# Patient Record
Sex: Female | Born: 1955 | Race: Black or African American | Hispanic: No | Smoking: Never smoker
Health system: Southern US, Community
[De-identification: ages and names within clinical notes are randomized; demographics above are authoritative.]

## PROBLEM LIST (undated history)

## (undated) ENCOUNTER — Ambulatory Visit

## (undated) ENCOUNTER — Encounter

## (undated) ENCOUNTER — Telehealth

## (undated) ENCOUNTER — Ambulatory Visit: Payer: BLUE CROSS/BLUE SHIELD | Attending: Family Medicine | Primary: Family Medicine

## (undated) ENCOUNTER — Ambulatory Visit: Payer: MEDICARE

## (undated) ENCOUNTER — Encounter
Attending: Student in an Organized Health Care Education/Training Program | Primary: Student in an Organized Health Care Education/Training Program

## (undated) ENCOUNTER — Encounter: Attending: Dermatology | Primary: Dermatology

## (undated) ENCOUNTER — Telehealth: Attending: Family Medicine | Primary: Family Medicine

## (undated) ENCOUNTER — Ambulatory Visit: Attending: Pharmacist | Primary: Pharmacist

## (undated) ENCOUNTER — Encounter: Attending: Family Medicine | Primary: Family Medicine

## (undated) ENCOUNTER — Ambulatory Visit: Payer: BLUE CROSS/BLUE SHIELD

## (undated) ENCOUNTER — Ambulatory Visit: Payer: BLUE CROSS/BLUE SHIELD | Attending: Family | Primary: Family

## (undated) ENCOUNTER — Ambulatory Visit
Payer: MEDICARE | Attending: Student in an Organized Health Care Education/Training Program | Primary: Student in an Organized Health Care Education/Training Program

## (undated) ENCOUNTER — Ambulatory Visit: Payer: BLUE CROSS/BLUE SHIELD | Attending: Rheumatology | Primary: Rheumatology

## (undated) ENCOUNTER — Encounter: Attending: Rheumatology | Primary: Rheumatology

## (undated) ENCOUNTER — Encounter: Attending: Diagnostic Radiology | Primary: Diagnostic Radiology

## (undated) ENCOUNTER — Encounter: Payer: BLUE CROSS/BLUE SHIELD | Attending: Family Medicine | Primary: Family Medicine

## (undated) ENCOUNTER — Ambulatory Visit
Payer: BLUE CROSS/BLUE SHIELD | Attending: Student in an Organized Health Care Education/Training Program | Primary: Student in an Organized Health Care Education/Training Program

## (undated) DIAGNOSIS — L409 Psoriasis, unspecified: Secondary | ICD-10-CM

## (undated) HISTORY — PX: KNEE SURGERY: SHX244

---
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
## 2009-10-13 ENCOUNTER — Ambulatory Visit: Payer: Self-pay

## 2010-02-12 ENCOUNTER — Emergency Department: Payer: Self-pay | Admitting: Emergency Medicine

## 2010-11-19 ENCOUNTER — Ambulatory Visit: Payer: Self-pay

## 2011-12-30 ENCOUNTER — Ambulatory Visit: Payer: Self-pay | Admitting: Family Medicine

## 2012-06-13 IMAGING — MG MM DIGITAL SCREENING BILAT W/ CAD
1 series · 7 of 7 positions shown · non-contrast
Comparison: none

REASON FOR EXAM: SCR MAMMO
COMMENTS:

[Series 1955: R CC · right · 7 of 7 slices shown]
[im 1/7]
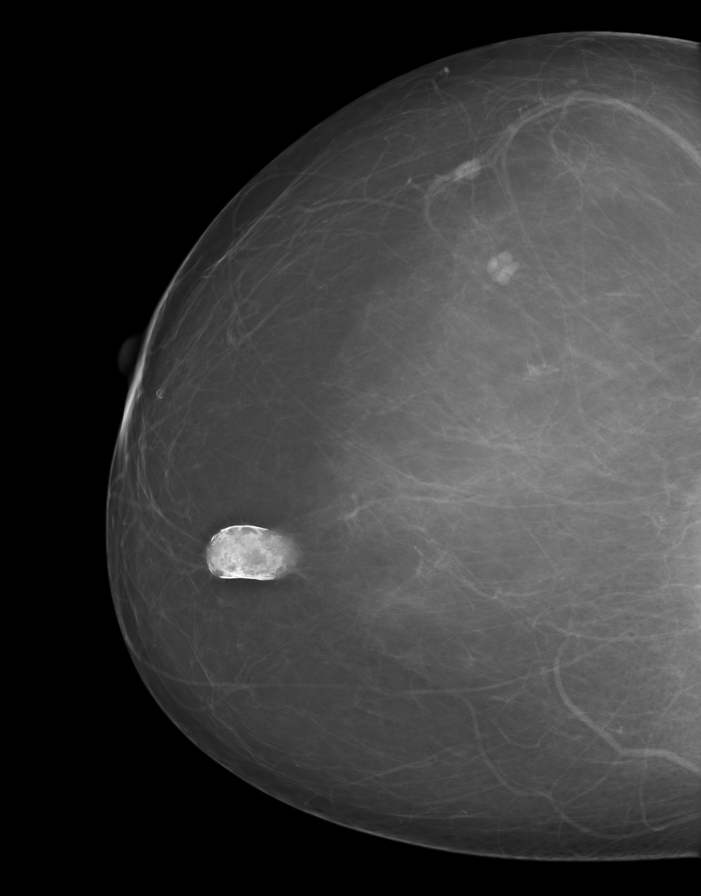
[im 2/7]
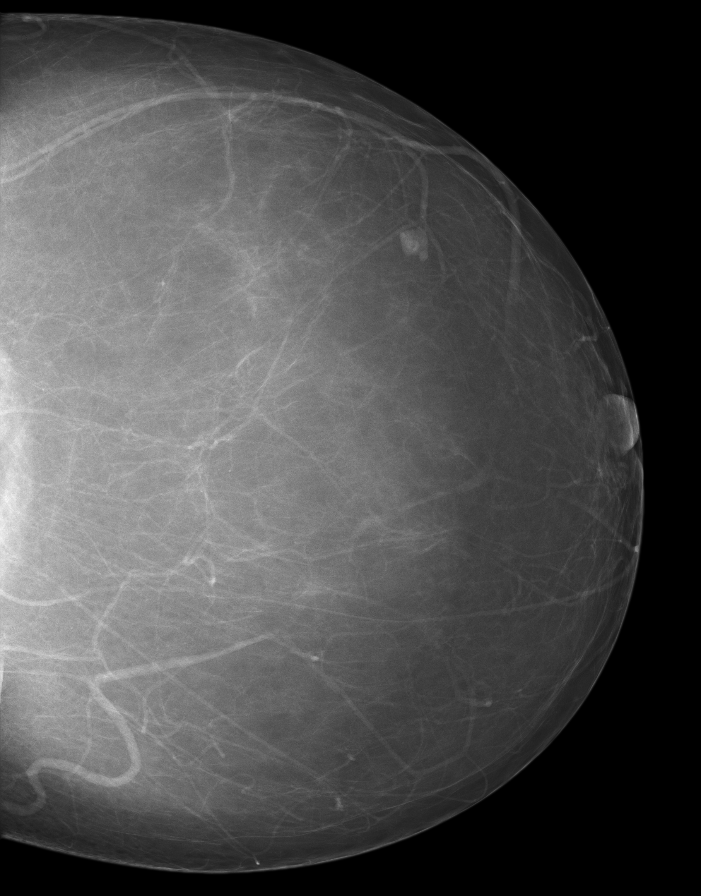
[im 3/7]
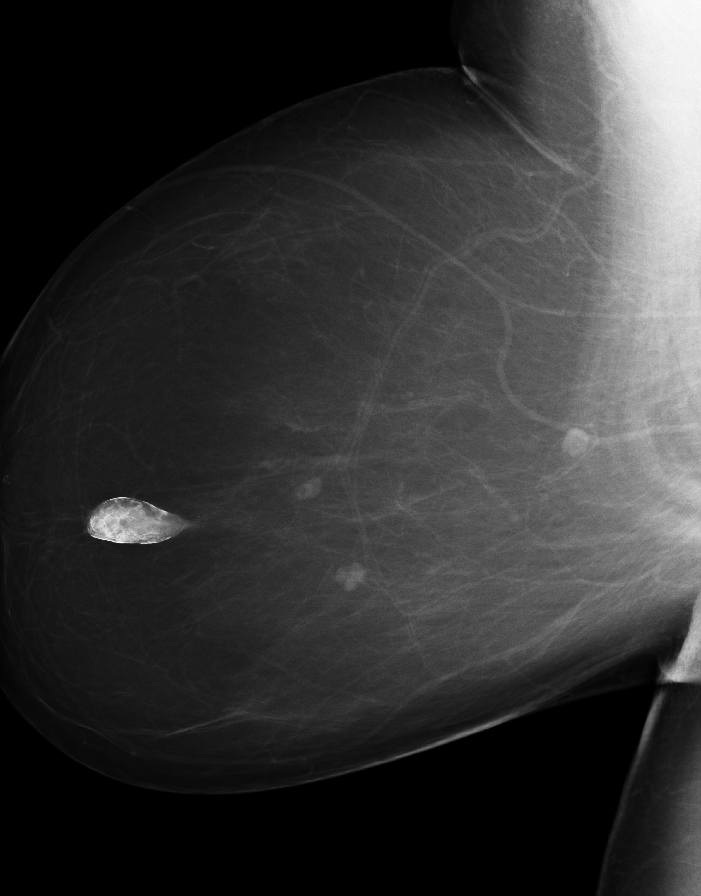
[im 4/7]
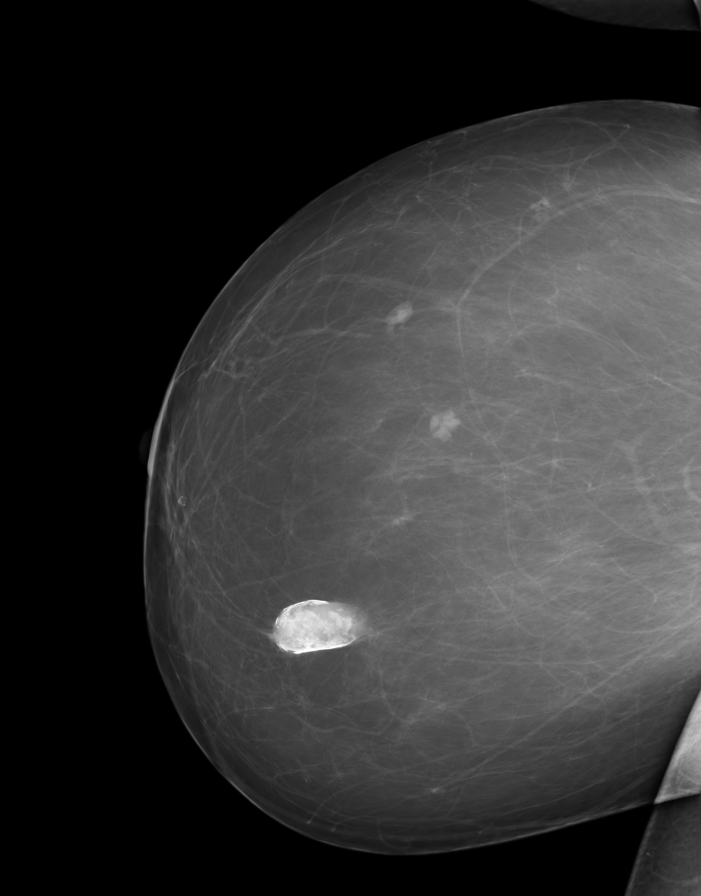
[im 5/7]
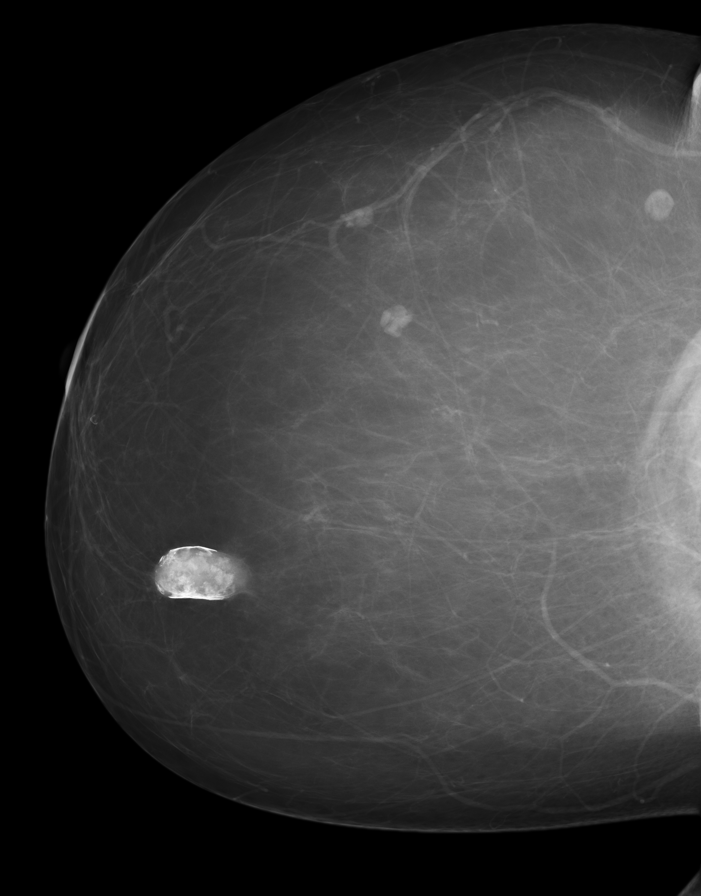
[im 6/7]
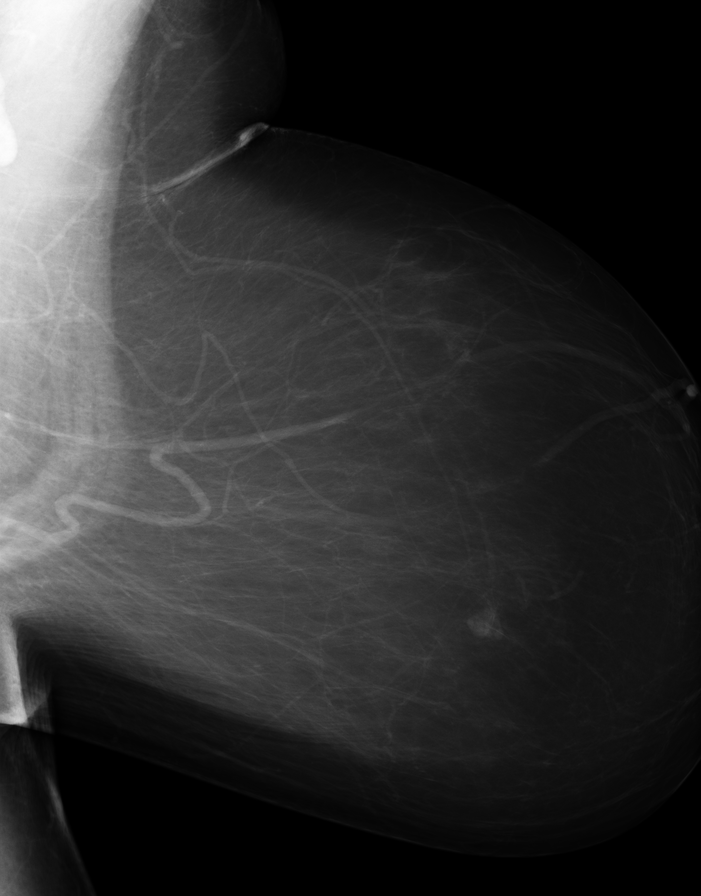
[im 7/7]
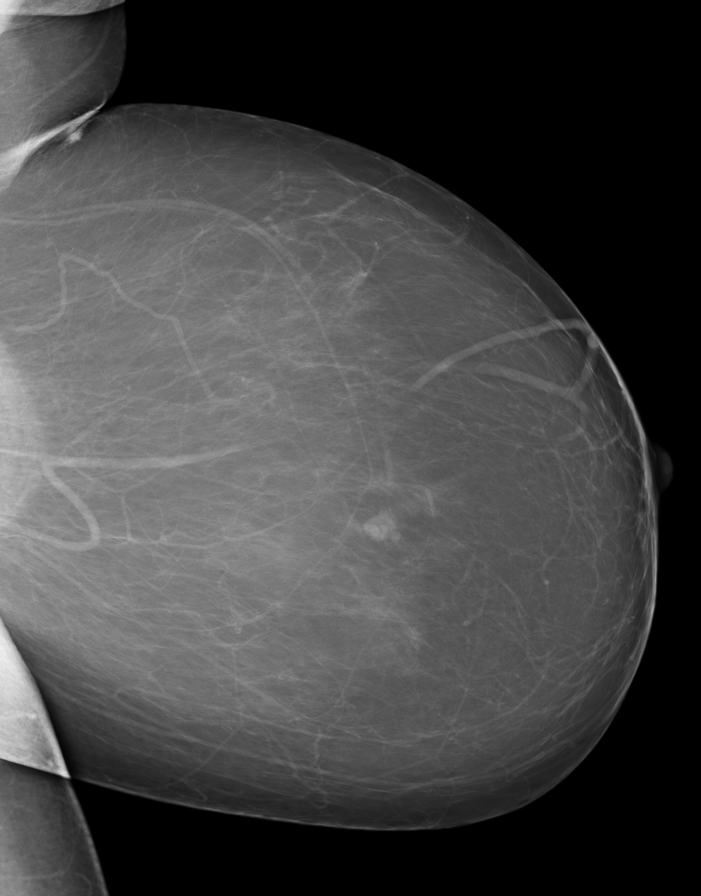

[7 of 7 positions shown; findings below may reference images not displayed]

PROCEDURE:     MMM - MMM DGTL SCREENING MAMMO W/CAD  - November 19, 2010  [DATE]

RESULT:     Comparison is made to study 13 October, 2009, as well as 05 December, 2007.

The breasts exhibit an involutional pattern. There is stable nodularity
bilaterally. A calcified mass in the anterior aspect of the right breast
medially is present and stable. I see no malignant appearing grouping of
microcalcification and no area of new architectural distortion.
IMPRESSION: 1.I do not see findings suspicious for malignancy.

BI-RADS: Category 2 - Benign Findings

RECOMMENDATIONS:

1.     Please continue to encourage yearly mammographic follow-up.

A NEGATIVE MAMMOGRAM REPORT DOES NOT PRECLUDE BIOPSY OR OTHER EVALUATION OF
A CLINICALLY PALPABLE OR OTHERWISE SUSPICIOUS MASS OR LESION. BREAST CANCER
MAY NOT BE DETECTED BY MAMMOGRAPHY IN UP TO 10% OF CASES.

## 2012-10-05 DIAGNOSIS — L409 Psoriasis, unspecified: Secondary | ICD-10-CM | POA: Insufficient documentation

## 2012-11-08 ENCOUNTER — Emergency Department: Payer: Self-pay | Admitting: Emergency Medicine

## 2012-12-01 DIAGNOSIS — I10 Essential (primary) hypertension: Secondary | ICD-10-CM | POA: Insufficient documentation

## 2013-02-07 ENCOUNTER — Ambulatory Visit: Payer: Self-pay | Admitting: Family Medicine

## 2013-07-24 IMAGING — MG MM DIGITAL SCREENING BILAT W/ CAD
1 series · 7 of 7 positions shown · non-contrast
Comparison: none

REASON FOR EXAM: SCR MAMMO NO ORDER
COMMENTS:

[Series 9202: R CC · right · 7 of 7 slices shown]
[im 1/7]
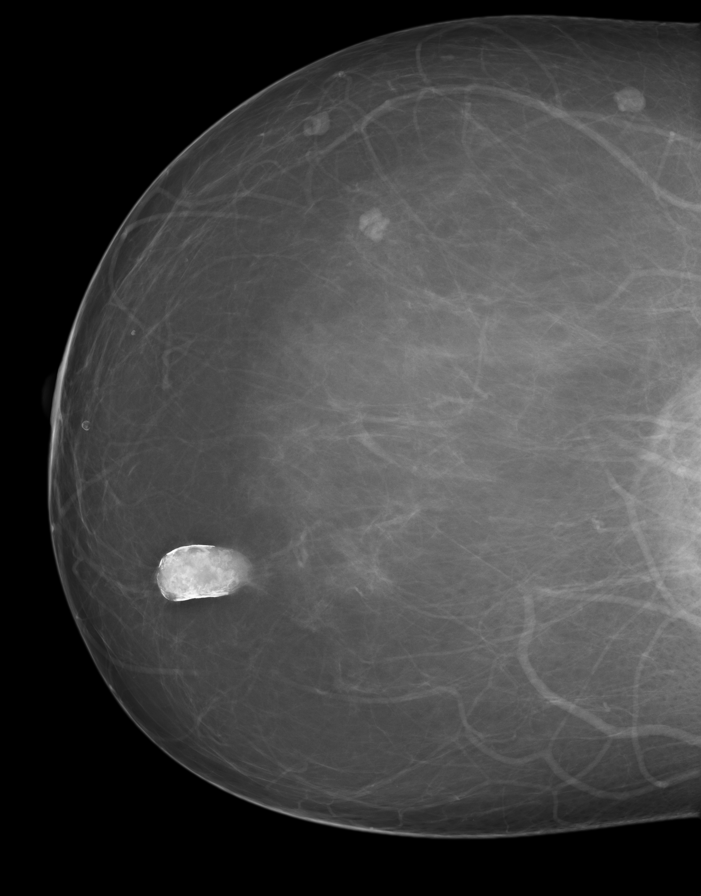
[im 2/7]
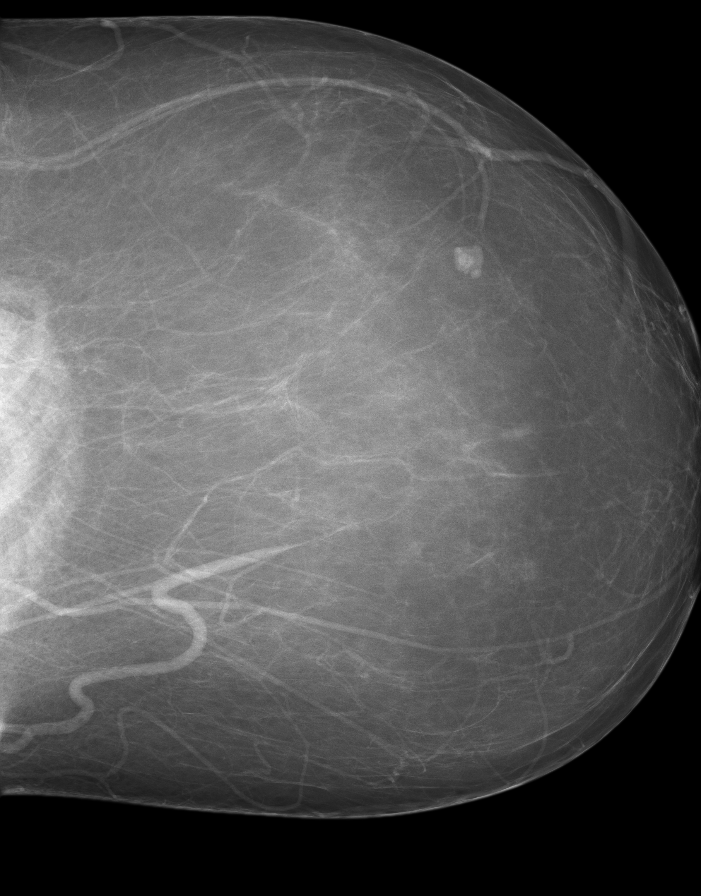
[im 3/7]
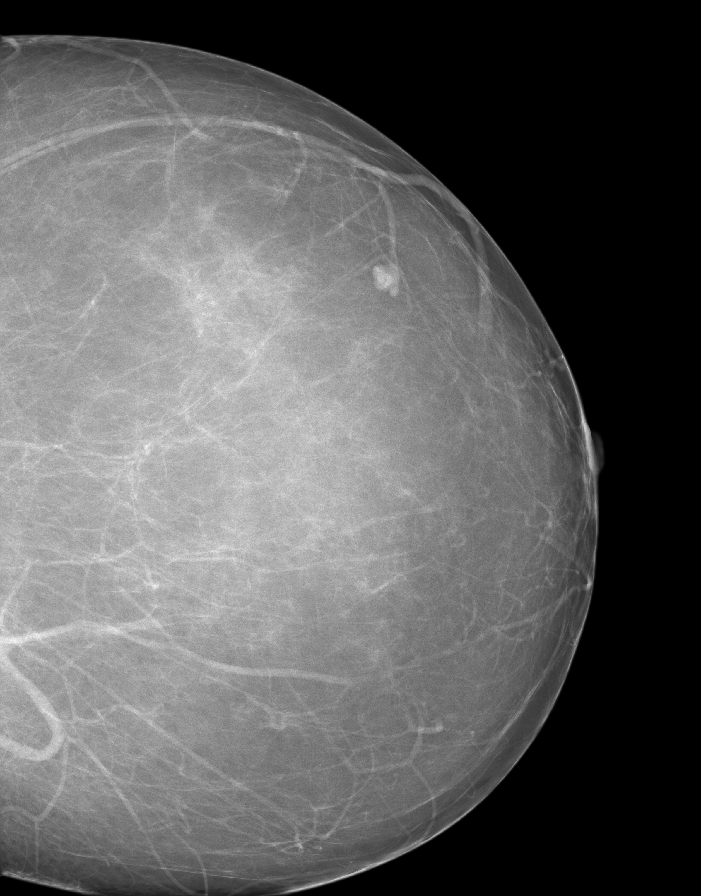
[im 4/7]
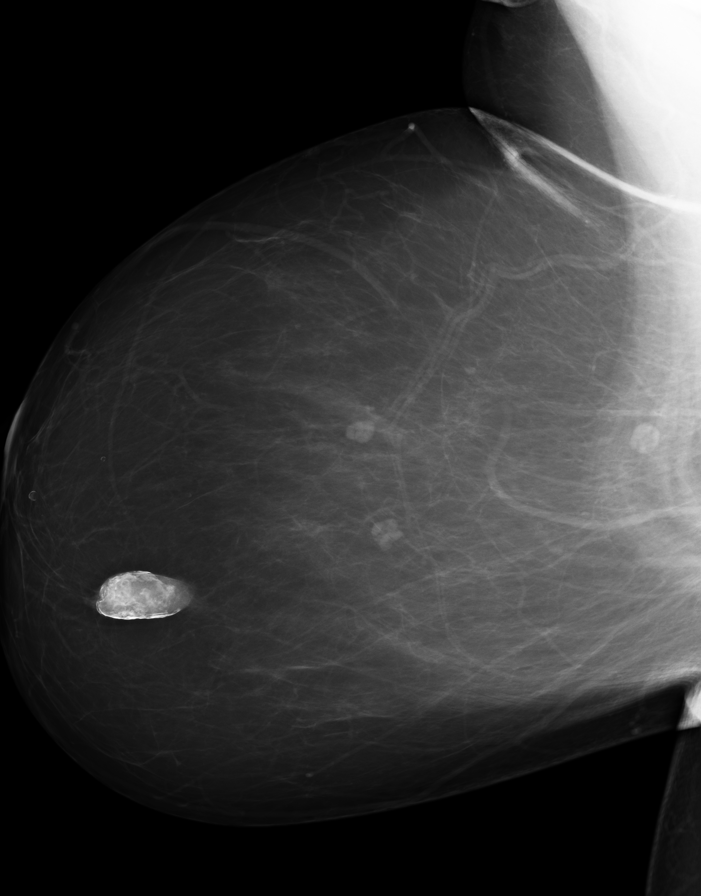
[im 5/7]
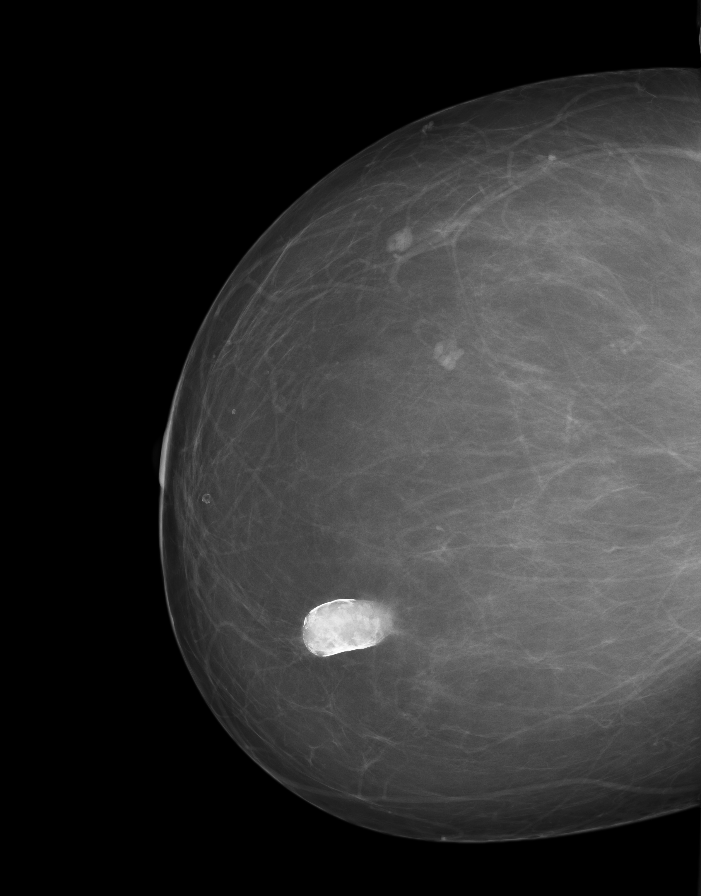
[im 6/7]
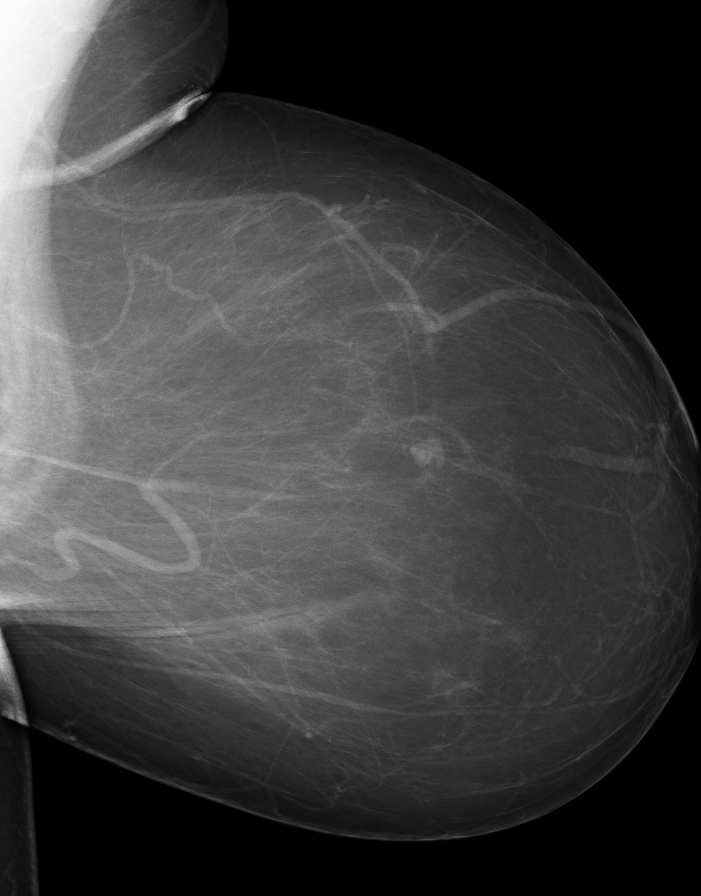
[im 7/7]
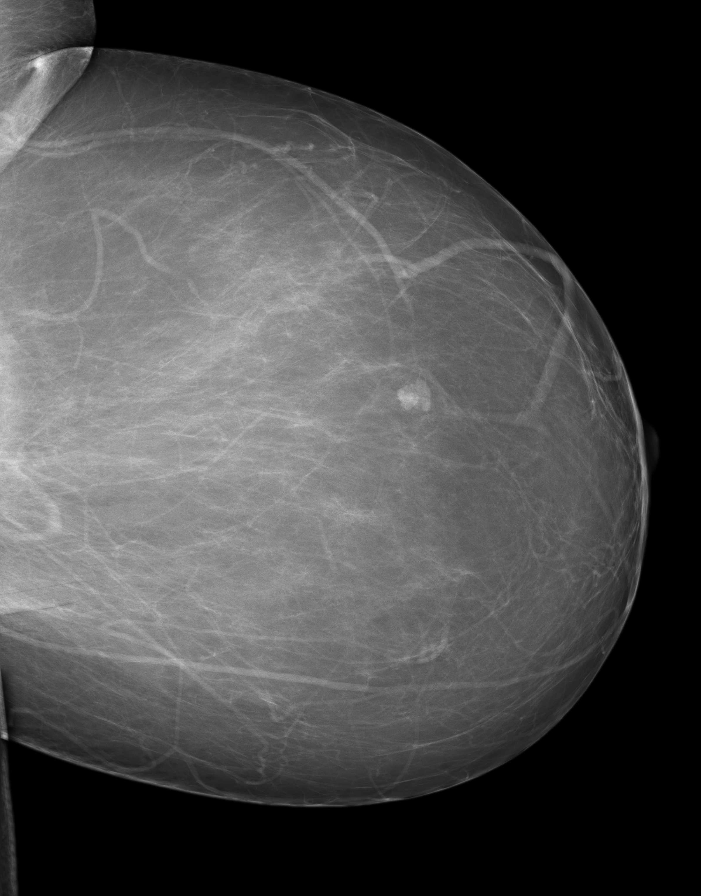

[7 of 7 positions shown; findings below may reference images not displayed]

PROCEDURE:     MMM - MMM DGT SCR NO ORDER W/CAD  - December 30, 2011  [DATE]

RESULT:     There is a family history of breast cancer in the patient's
maternal aunt, mother and sister diagnosed in their 60s and 70s with breast
cancer. The patient has no history of breast cancer or breast surgery.
Comparison is made to previous digital mammographic studies 19 November, 2010, as well as 13 October, 2009 and 10 May, 2006. There is a minimal
parenchymal density with some stable areas of nodularity laterally in the
mid left breast and laterally in the mid to upper right breast with a large
coarse calcific density medially in the anterior right breast near the level
of the nipple. No developing or dominant mass or malignant calcification is
present otherwise.
IMPRESSION: 1. Stable, benign-appearing bilateral mammogram.

BI-RADS: Category 2 - Benign Finding.

Please continue to encourage annual mammographic follow-up and monthly
breast self exam.

A NEGATIVE MAMMOGRAM REPORT DOES NOT PRECLUDE BIOPSY OR OTHER EVALUATION OF
A CLINICALLY PALPABLE OR OTHERWISE SUSPICIOUS MASS OR LESION. BREAST CANCER
MAY NOT BE DETECTED BY MAMMOGRAPHY IN UP TO 10% OF CASES.

[REDACTED]

## 2014-03-19 ENCOUNTER — Ambulatory Visit: Payer: Self-pay | Admitting: Family Medicine

## 2015-03-10 ENCOUNTER — Other Ambulatory Visit: Payer: Self-pay | Admitting: Family Medicine

## 2015-03-13 ENCOUNTER — Other Ambulatory Visit: Payer: Self-pay | Admitting: Family Medicine

## 2015-03-13 DIAGNOSIS — Z139 Encounter for screening, unspecified: Secondary | ICD-10-CM

## 2015-04-09 ENCOUNTER — Ambulatory Visit: Payer: Self-pay

## 2015-04-09 ENCOUNTER — Ambulatory Visit
Admission: RE | Admit: 2015-04-09 | Discharge: 2015-04-09 | Disposition: A | Discharge status: Home / Self Care | Payer: Medicaid Other | Source: Ambulatory Visit | Attending: Family Medicine | Admitting: Family Medicine

## 2015-04-09 DIAGNOSIS — Z139 Encounter for screening, unspecified: Secondary | ICD-10-CM

## 2015-04-09 DIAGNOSIS — Z1231 Encounter for screening mammogram for malignant neoplasm of breast: Secondary | ICD-10-CM | POA: Diagnosis present

## 2015-09-20 ENCOUNTER — Emergency Department
Admission: EM | Admit: 2015-09-20 | Discharge: 2015-09-20 | Disposition: A | Discharge status: Home / Self Care | Payer: Medicaid Other | Attending: Emergency Medicine | Admitting: Emergency Medicine

## 2015-09-20 ENCOUNTER — Encounter: Payer: Self-pay | Admitting: Emergency Medicine

## 2015-09-20 DIAGNOSIS — Y929 Unspecified place or not applicable: Secondary | ICD-10-CM | POA: Insufficient documentation

## 2015-09-20 DIAGNOSIS — Y9389 Activity, other specified: Secondary | ICD-10-CM | POA: Insufficient documentation

## 2015-09-20 DIAGNOSIS — S60455A Superficial foreign body of left ring finger, initial encounter: Secondary | ICD-10-CM | POA: Insufficient documentation

## 2015-09-20 DIAGNOSIS — W458XXA Other foreign body or object entering through skin, initial encounter: Secondary | ICD-10-CM | POA: Diagnosis not present

## 2015-09-20 DIAGNOSIS — Y999 Unspecified external cause status: Secondary | ICD-10-CM | POA: Insufficient documentation

## 2015-09-20 DIAGNOSIS — S6991XA Unspecified injury of right wrist, hand and finger(s), initial encounter: Secondary | ICD-10-CM

## 2015-09-20 HISTORY — DX: Psoriasis, unspecified: L40.9

## 2015-09-20 MED ORDER — TETANUS-DIPHTHERIA TOXOIDS TD 5-2 LFU IM INJ
INJECTION | INTRAMUSCULAR | Status: AC
  Filled 2015-09-20: qty 0.5

## 2015-09-20 MED ORDER — LIDOCAINE HCL (PF) 1 % IJ SOLN
5.0000 mL | Freq: Once | INTRAMUSCULAR | Status: AC
  Administered 2015-09-20: 5 mL
  Filled 2015-09-20: qty 5

## 2015-09-20 MED ORDER — TETANUS-DIPHTH-ACELL PERTUSSIS 5-2.5-18.5 LF-MCG/0.5 IM SUSP
0.5000 mL | Freq: Once | INTRAMUSCULAR | Status: AC
  Administered 2015-09-20: 0.5 mL via INTRAMUSCULAR

## 2015-09-20 NOTE — ED Notes (Signed)
Fishhook caught ring finger R hand.

## 2015-09-20 NOTE — ED Provider Notes (Signed)
[facility] Emergency Department Provider Note  ____________________________________________  Time seen: Approximately 3:11 PM  I have reviewed the triage vital signs and the nursing notes.   HISTORY  Chief Complaint Foreign Body in Skin    HPI Deborah Becker is a 60 y.o. female who presents emergency department complaining of a fishhook caught in her fourth digit right hand. Patient states that she was cleaning out her vehicle when she accidentally stuck her finger on the fishhook. Patient tried to remove it at home but was unable to. Patient's tetanus is greater than 5 years ago. She denies any other injury or complaint. No pain at this time.   Past Medical History  Diagnosis Date  . Psoriasis     There are no active problems to display for this patient.   Past Surgical History  Procedure Laterality Date  . Knee surgery      No current outpatient prescriptions on file.  Allergies Review of patient's allergies indicates no known allergies.  Family History  Problem Relation Age of Onset  . Breast cancer Mother 4260  . Breast cancer Sister 3860  . Breast cancer Maternal Aunt 2470    Social History Social History  Substance Use Topics  . Smoking status: Never Smoker   . Smokeless tobacco: None  . Alcohol Use: No     Review of Systems  Constitutional: No fever/chills Cardiovascular: no chest pain. Respiratory: no cough. No SOB. Musculoskeletal: Negative for musculoskeletal pain. Skin: Positive for embedded fishhook to the fourth digit right hand Neurological: Negative for headaches, focal weakness or numbness. 10-point ROS otherwise negative.  ____________________________________________   PHYSICAL EXAM:  VITAL SIGNS: ED Triage Vitals  Enc Vitals Group     BP 09/20/15 1435 168/89 mmHg     Pulse Rate 09/20/15 1435 101     Resp 09/20/15 1435 20     Temp 09/20/15 1435 98 F (36.7 C)     Temp Source 09/20/15 1435 Oral     SpO2  09/20/15 1435 97 %     Weight 09/20/15 1435 260 lb (117.935 kg)     Height 09/20/15 1435 5\' 9"  (1.753 m)     Head Cir --      Peak Flow --      Pain Score --      Pain Loc --      Pain Edu? --      Excl. in GC? --      Constitutional: Alert and oriented. Well appearing and in no acute distress. Eyes: Conjunctivae are normal. PERRL. EOMI. Head: Atraumatic. Cardiovascular: Normal rate, regular rhythm. Normal S1 and S2.  Good peripheral circulation. Respiratory: Normal respiratory effort without tachypnea or retractions. Lungs CTAB. Good air entry to the bases with no decreased or absent breath sounds. Musculoskeletal: Full range of motion to all extremities. No gross deformities appreciated. Neurologic:  Normal speech and language. No gross focal neurologic deficits are appreciated.  Skin:  Skin is warm, dry and intact. No rash noted. Tryi-hook fishhook is embedded in the skin of the fourth digit right hand. Lesle ReekBarb is completely through the superficial skin. No bleeding. Full range of motion to digit. Sensation and cap refill intact distally. Psychiatric: Mood and affect are normal. Speech and behavior are normal. Patient exhibits appropriate insight and judgement.   ____________________________________________   LABS (all labs ordered are listed, but only abnormal results are displayed)  Labs Reviewed - No data to display ____________________________________________  EKG   ____________________________________________  RADIOLOGY  No results found.  ____________________________________________    PROCEDURES  Procedure(s) performed:    Fish hook removal  Performed by: Racheal PatchesJonathan D Cuthriell Authorized by: Racheal PatchesJonathan D Cuthriell Consent: Verbal consent obtained. Risks and benefits: risks, benefits and alternatives were discussed Consent given by: patient Patient identity confirmed: provided demographic data Prepped and Draped in normal sterile fashion Wound  explored   Location: Fourth digit right hand  Anesthesia: local infiltration  Local anesthetic: lidocaine 1 % without epinephrine  Anesthetic total: 1 ml  Irrigation method: syringe Amount of cleaning: standard  Technique: Due to the nature of injury with a tri-hook fishhook, trauma shears were unable to get near [place] to cut it to back out fishhook. As such, lidocaine was injected under area and 11 blade scalpel was used to clean incision to remove the fishhook. This is superficial in nature. Patient tolerated well. No cough location's. No bleeding status post procedure. Finger is thoroughly cleansed using antiseptic cleanser and bandaged.   Patient tolerance: Patient tolerated the procedure well with no immediate complications.    Medications  lidocaine (PF) (XYLOCAINE) 1 % injection 5 mL (not administered)  Tdap (BOOSTRIX) injection 0.5 mL (not administered)     ____________________________________________   INITIAL IMPRESSION / ASSESSMENT AND PLAN / ED COURSE  Pertinent labs & imaging results that were available during my care of the patient were reviewed by me and considered in my medical decision making (see chart for details).  Patient's diagnosis is consistent with embedded fishhook in left fourth digit right hand. This is removed as described above. Patient tolerated well. Patient was in need of tetanus update and is given today.. Wound care instructions are given to patient. Patient will follow-up with primary care as needed. Patient is given ED precautions to return to the ED for any worsening or new symptoms.     ____________________________________________  FINAL CLINICAL IMPRESSION(S) / ED DIAGNOSES  Final diagnoses:  Fishhook injury to finger, right, initial encounter      NEW MEDICATIONS STARTED DURING THIS VISIT:  New Prescriptions   No medications on file        This chart was dictated using voice recognition software/Dragon. Despite best  efforts to proofread, errors can occur which can change the meaning. Any change was purely unintentional.    Racheal PatchesJonathan D Cuthriell, PA-C 09/20/15 1529  Jennye MoccasinBrian S Quigley, MD 09/20/15 [phone]

## 2015-09-20 NOTE — ED Notes (Signed)
Patient was reaching into a box and got a fish hook stuck in her finger, patient states that she tried to get it out but was unable to.

## 2015-09-20 NOTE — Discharge Instructions (Signed)

## 2016-04-28 ENCOUNTER — Other Ambulatory Visit: Payer: Self-pay | Admitting: Family Medicine

## 2016-05-04 ENCOUNTER — Other Ambulatory Visit: Payer: Self-pay | Admitting: Family Medicine

## 2016-05-04 DIAGNOSIS — Z0000 Encounter for general adult medical examination without abnormal findings: Secondary | ICD-10-CM

## 2016-05-17 ENCOUNTER — Ambulatory Visit
Admission: RE | Admit: 2016-05-17 | Discharge: 2016-05-17 | Disposition: A | Discharge status: Home / Self Care | Payer: Medicaid Other | Source: Ambulatory Visit | Attending: Family Medicine | Admitting: Family Medicine

## 2016-05-17 DIAGNOSIS — Z0000 Encounter for general adult medical examination without abnormal findings: Secondary | ICD-10-CM | POA: Insufficient documentation

## 2016-05-17 DIAGNOSIS — Z1231 Encounter for screening mammogram for malignant neoplasm of breast: Secondary | ICD-10-CM | POA: Diagnosis present

## 2016-11-02 ENCOUNTER — Ambulatory Visit: Admission: RE | Admit: 2016-11-02 | Discharge: 2016-11-02 | Payer: MEDICAID | Admitting: Family Medicine

## 2016-11-02 DIAGNOSIS — I10 Essential (primary) hypertension: Principal | ICD-10-CM

## 2016-11-02 DIAGNOSIS — L739 Follicular disorder, unspecified: Secondary | ICD-10-CM

## 2016-11-02 DIAGNOSIS — Z0000 Encounter for general adult medical examination without abnormal findings: Secondary | ICD-10-CM

## 2016-11-23 ENCOUNTER — Ambulatory Visit: Admission: RE | Admit: 2016-11-23 | Discharge: 2016-11-23

## 2016-11-23 DIAGNOSIS — I10 Essential (primary) hypertension: Principal | ICD-10-CM

## 2017-04-28 ENCOUNTER — Encounter: Admit: 2017-04-28 | Discharge: 2017-04-29 | Payer: BLUE CROSS/BLUE SHIELD

## 2017-04-28 DIAGNOSIS — Z5181 Encounter for therapeutic drug level monitoring: Secondary | ICD-10-CM

## 2017-04-28 DIAGNOSIS — L409 Psoriasis, unspecified: Principal | ICD-10-CM

## 2017-04-28 DIAGNOSIS — D489 Neoplasm of uncertain behavior, unspecified: Secondary | ICD-10-CM

## 2017-04-28 MED ORDER — METHOTREXATE SODIUM 2.5 MG TABLET
ORAL_TABLET | ORAL | 2 refills | 0.00000 days | Status: CP
Start: 2017-04-28 — End: 2017-11-05

## 2017-04-28 MED ORDER — FOLIC ACID 1 MG TABLET
ORAL_TABLET | Freq: Every day | ORAL | 3 refills | 0.00000 days | Status: CP
Start: 2017-04-28 — End: 2017-10-18

## 2017-04-28 MED ORDER — TRIAMCINOLONE ACETONIDE 0.1 % TOPICAL OINTMENT
INTRAMUSCULAR | 6 refills | 0.00000 days | Status: CP
Start: 2017-04-28 — End: 2017-10-18

## 2017-04-28 MED ORDER — CLOBETASOL 0.05 % TOPICAL OINTMENT
OPHTHALMIC | 3 refills | 0.00000 days | Status: CP
Start: 2017-04-28 — End: 2017-10-18

## 2017-06-27 ENCOUNTER — Other Ambulatory Visit: Payer: Self-pay | Admitting: Family Medicine

## 2017-08-23 ENCOUNTER — Encounter: Admit: 2017-08-23 | Discharge: 2017-08-24 | Payer: BLUE CROSS/BLUE SHIELD

## 2017-08-25 ENCOUNTER — Encounter: Admit: 2017-08-25 | Discharge: 2017-08-26 | Payer: BLUE CROSS/BLUE SHIELD

## 2017-08-25 DIAGNOSIS — Z1239 Encounter for other screening for malignant neoplasm of breast: Principal | ICD-10-CM

## 2017-10-18 ENCOUNTER — Encounter: Admit: 2017-10-18 | Discharge: 2017-10-19 | Payer: BLUE CROSS/BLUE SHIELD

## 2017-10-18 DIAGNOSIS — L409 Psoriasis, unspecified: Principal | ICD-10-CM

## 2017-10-18 MED ORDER — FOLIC ACID 1 MG TABLET
ORAL_TABLET | Freq: Every day | ORAL | 3 refills | 0.00000 days | Status: CP
Start: 2017-10-18 — End: 2018-10-18

## 2017-10-18 MED ORDER — METHOTREXATE SODIUM 2.5 MG TABLET
ORAL_TABLET | 2 refills | 0.00000 days | Status: CP
Start: 2017-10-18 — End: 2018-03-03

## 2017-10-18 MED ORDER — CLOBETASOL 0.05 % TOPICAL OINTMENT
OPHTHALMIC | 3 refills | 0.00000 days | Status: CP
Start: 2017-10-18 — End: ?

## 2017-10-18 MED ORDER — TRIAMCINOLONE ACETONIDE 0.1 % TOPICAL OINTMENT
INTRAMUSCULAR | 6 refills | 0.00000 days | Status: CP
Start: 2017-10-18 — End: 2018-03-03

## 2017-11-07 MED ORDER — METHOTREXATE SODIUM 2.5 MG TABLET
ORAL_TABLET | 2 refills | 0.00000 days | Status: CP
Start: 2017-11-07 — End: 2018-03-03

## 2018-03-03 ENCOUNTER — Encounter: Admit: 2018-03-03 | Discharge: 2018-03-04 | Payer: BLUE CROSS/BLUE SHIELD

## 2018-03-03 DIAGNOSIS — Z79899 Other long term (current) drug therapy: Principal | ICD-10-CM

## 2018-03-03 DIAGNOSIS — L409 Psoriasis, unspecified: Secondary | ICD-10-CM

## 2018-03-03 MED ORDER — TRIAMCINOLONE ACETONIDE 0.1 % TOPICAL OINTMENT
INTRAMUSCULAR | 6 refills | 0.00000 days | Status: CP
Start: 2018-03-03 — End: ?

## 2018-03-03 MED ORDER — COSENTYX PEN 300 MG/2 PENS (150 MG/ML) SUBCUTANEOUS
SUBCUTANEOUS | 0 refills | 0.00000 days | Status: CP
Start: 2018-03-03 — End: 2018-06-26
  Filled 2018-03-09: qty 8, 28d supply, fill #0

## 2018-03-03 MED ORDER — SECUKINUMAB 150 MG/ML SUBCUTANEOUS PEN INJECTOR
0 refills | 0 days
Start: 2018-03-03 — End: ?

## 2018-03-06 NOTE — Unmapped (Signed)
Per test claim for Cosentyx at the [facility], patient needs Medication Assistance Program for Prior Authorization.

## 2018-03-07 LAB — QUANTIFERON TB GOLD PLUS
QUANTIFERON ANTIGEN 2 MINUS NIL: -0.03 [IU]/mL
QUANTIFERON TB GOLD PLUS: NEGATIVE
QUANTIFERON TB NIL VALUE: 0.06 [IU]/mL

## 2018-03-07 LAB — QUANTIFERON TB NIL VALUE: Lab: 0.06

## 2018-03-07 NOTE — Unmapped (Signed)
[facility] Specialty Medication Referral: PA Approved      Medication (Brand/Generic): Cosentyx    Final Test Claim completed with resulted information below:    Patient ABLE to fill at [facility]  Insurance Company:  [facility] Tracks  Anticipated Copay: $3  Is anticipated copay with a copay card or grant? No    Does patient's insurance plan only allow a 15 day supply for the first 6 fills in the Split Fill Program? No  If yes, inform patient they can request to dis-enroll from the [facility] by calling the patient help desk at N/A.      If the copay is under the $25 defined limit, per policy there will be no further investigation of need for financial assistance at this time unless patient requests. This referral has been communicated to the provider and handed off to the [facility] team for further processing and filling of prescribed medication.   ______________________________________________________________________  Please utilize this referral for viewing purposes as it will serve as the central location for all relevant documentation and updates.

## 2018-03-08 NOTE — Unmapped (Signed)
[facility]   Patient Onboarding/Medication Counseling    Ms.Ludlam is a 62 y.o. female with Psoriasis who I am counseling today on initiation of therapy.    Medication: Cosentyx    Verified patient's date of birth / HIPAA.      Education Provided: ?    Dose/Administration discussed: Inject the contents of 2 pens (300mg ) under the skin weekly on weeks 0, 1, 2, 3 and 4 then inject the contents of 2 pens (300mg ) under the skin every 4 weeks thereafter. This medication should be taken  without regard to food.  Stressed the importance of taking medication as prescribed and to contact provider if that changes at any time.  Discussed missed dose instructions.    Storage requirements: this medicine should be stored in the refrigerator.     Side effects / precautions discussed: Discussed common side effects, including but not limited to injection site reactions, nose/throat irritation, headaches, diarrhea and increased risk of infection. If patient experiences signs of an allergic reaction, signs of infection, warm, red or painful skin or sores, excessive sweating, muscle pain or weakness, shortness of breath, increased frequency of urination or very bad dizziness or passing out or persistent or severe side effects they need to call the doctor.  Patient will receive a drug information handout with shipment.    Handling precautions / disposal reviewed:  Patient will dispose of needles in a sharps container or empty laundry detergent bottle.    Drug Interactions: other medications reviewed and up to date in Epic.  No drug interactions identified.    Comorbidities/Allergies: reviewed and up to date in Epic.    Verified therapy is appropriate and should continue      Delivery Information    Medication Assistance provided: Prior Authorization    Anticipated copay of $3 reviewed with patient. Verified delivery address in Epic.    Scheduled delivery date: 03/09/18  Medication will be delivered via Same Day Courier to the home address in [place].  This shipment will not require a signature.      Explained the services we provide at [facility] and that each month we would call to set up refills.  Stressed importance of returning phone calls so that we could ensure they receive their medications in time each month.  Informed patient that we should be setting up refills 7-10 days prior to when they will run out of medication.  Informed patient that welcome packet will be sent.      Patient verbalized understanding of the above information as well as how to contact the pharmacy at [phone] option 4 with any questions/concerns.  The pharmacy is open Monday through Friday 8:30am-4:30pm.  A pharmacist is available 24/7 via pager to answer any clinical questions they may have.        Patient Specific Needs      ? Patient has no physical, cognitive, or cultural barriers.    ? Patient prefers to have medications discussed with  Patient     ? Patient is able to read and understand education materials at a high school level or above.    ? Patient's primary language is  English           Arnold Long  [facility] Specialty Pharmacist

## 2018-03-08 NOTE — Unmapped (Signed)
Labs reassuring. Notified patient via letter. Ok to proceed with cosentyx

## 2018-03-09 MED FILL — COSENTYX PEN 300 MG/2 PENS (150 MG/ML) SUBCUTANEOUS: 28 days supply | Qty: 8 | Fill #0 | Status: AC

## 2018-03-09 NOTE — Unmapped (Signed)
Returned patient's call.  Informed patient she is to receive a letter about her lab results but, per Dr. Harrison Mons, labs were reassuring and it is OK to start her Cosentyx.  Patient states her medication is supposed to come today. Patient appreciative of call back.

## 2018-03-10 MED ORDER — EMPTY CONTAINER
2 refills | 0 days
Start: 2018-03-10 — End: ?

## 2018-03-10 MED FILL — EMPTY CONTAINER: 120 days supply | Qty: 1 | Fill #0 | Status: AC

## 2018-03-10 MED FILL — EMPTY CONTAINER: 120 days supply | Qty: 1 | Fill #0

## 2018-03-30 NOTE — Unmapped (Signed)
[facility] Refill and Clinical Coordination Note  Medication(s): Cosentyx 150mg /ml    Teresa Sloan, DOB: Sep 10, 1955  Phone: [phone] (home) , Alternate phone contact: N/A  Shipping address: [address]  [place] [address]  Phone or address changes today?: No  All above HIPAA information verified.  Insurance changes? No    Completed refill and clinical call assessment today to schedule patient's medication shipment from the [facility] [phone]).      MEDICATION RECONCILIATION    Confirmed the medication and dosage are correct and have not changed: Yes, regimen is correct and unchanged.    Were there any changes to your medication(s) in the past month:  No, there are no changes reported at this time.    ADHERENCE    Is this medicine transplant or covered by Medicare Part B? No.    Cosentyx 150mg /ml: Patient has 0 pens on hand.    Did you miss any doses in the past 4 weeks? No missed doses reported.  Adherence counseling provided? Not needed     SIDE EFFECT MANAGEMENT    Are you tolerating your medication?:  Teresa Sloan reports tolerating the medication.  Side effect management discussed: None      Therapy is appropriate and should be continued.    Evidence of clinical benefit: Do you feel that that the medication is helping? Yes      FINANCIAL/SHIPPING    Delivery Scheduled: Yes, Expected medication delivery date: 03/31/2018     Medication will be delivered via Same Day Courier to the home address in [place].    Additional medications refilled: No additional medications/refills needed at this time.    The patient will receive a drug information handout for each medication shipped and additional FDA Medication Guides as required.      [name] did not have any additional questions at this time.    Delivery address confirmed in Epic.     We will follow up with patient monthly for standard refill processing and delivery.      Thank you,  Roderic Palau   [facility] Specialty Pharmacist

## 2018-03-31 MED FILL — COSENTYX PEN 300 MG/2 PENS (150 MG/ML) SUBCUTANEOUS: 28 days supply | Qty: 2 | Fill #1 | Status: AC

## 2018-03-31 MED FILL — COSENTYX PEN 300 MG/2 PENS (150 MG/ML) SUBCUTANEOUS: 28 days supply | Qty: 2 | Fill #1

## 2018-04-20 NOTE — Unmapped (Signed)
[facility] Refill Coordination Note    Specialty Medication(s) to be Shipped:   Inflammatory Disorders: Cosentyx    Other medication(s) to be shipped: n/a     Teresa Sloan, DOB: 1956-01-06  Phone: [phone] (home)       All above HIPAA information was verified with patient.     Completed refill call assessment today to schedule patient's medication shipment from the [facility] [phone]).       Specialty medication(s) and dose(s) confirmed: Regimen is correct and unchanged.   Changes to medications: Teresa Sloan reports no changes reported at this time.  Changes to insurance: No  Questions for the pharmacist:Yes: patient asked about alternative injection site rph rachel ross     Confirmed patient received Welcome Packet with first shipment. The patient will receive a drug information handout for each medication shipped and additional FDA Medication Guides as required.       DISEASE/MEDICATION-SPECIFIC INFORMATION        N/A    SPECIALTY MEDICATION ADHERENCE     Medication Adherence    Patient reported X missed doses in the last month:  0  Specialty Medication:  cosentyx  Patient is on additional specialty medications:  No  Patient is on more than two specialty medications:  No  Any gaps in refill history greater than 2 weeks in the last 3 months:  no  Demonstrates understanding of importance of adherence:  yes  Informant:  patient  Reliability of informant:  reliable  Adherence tools used:  calendar  Confirmed plan for next specialty medication refill:  delivery by pharmacy  Refills needed for supportive medications:  not needed          Refill Coordination    Has the Patients' Contact Information Changed:  No  Is the Shipping Address Different:  No           cosentyx pen 150mg /ml inj patient is out of med      [facility]     Shipping address confirmed in Epic.     Delivery Scheduled: Yes, Expected medication delivery date: 020720.     Medication will be delivered via Same Day Courier to the home address in Epic [place].    Teresa Sloan   [facility] Specialty Technician

## 2018-04-28 MED FILL — COSENTYX PEN 300 MG/2 PENS (150 MG/ML) SUBCUTANEOUS: 28 days supply | Qty: 2 | Fill #2 | Status: AC

## 2018-04-28 MED FILL — COSENTYX PEN 300 MG/2 PENS (150 MG/ML) SUBCUTANEOUS: 28 days supply | Qty: 2 | Fill #2

## 2018-05-17 NOTE — Unmapped (Signed)
[facility] Refill Coordination Note    Specialty Medication(s) to be Shipped:   Inflammatory Disorders: Cosentyx    Other medication(s) to be shipped: naa     Teresa Sloan, DOB: 1955-03-27  Phone: [phone] (home)       All above HIPAA information was verified with patient.     Completed refill call assessment today to schedule patient's medication shipment from the [facility] [phone]).       Specialty medication(s) and dose(s) confirmed: Regimen is correct and unchanged.   Changes to medications: Teresa Sloan reports no changes reported at this time.  Changes to insurance: No  Questions for the pharmacist: No    Confirmed patient received Welcome Packet with first shipment. The patient will receive a drug information handout for each medication shipped and additional FDA Medication Guides as required.       DISEASE/MEDICATION-SPECIFIC INFORMATION        N/A    SPECIALTY MEDICATION ADHERENCE     Medication Adherence    Patient reported X missed doses in the last month:  0  Specialty Medication:  cosentyx  Patient is on additional specialty medications:  No  Patient is on more than two specialty medications:  No  Any gaps in refill history greater than 2 weeks in the last 3 months:  no  Demonstrates understanding of importance of adherence:  yes  Informant:  patient  Reliability of informant:  reliable  Adherence tools used:  calendar  Confirmed plan for next specialty medication refill:  delivery by pharmacy  Refills needed for supportive medications:  not needed          Refill Coordination    Has the Patients' Contact Information Changed:  No  Is the Shipping Address Different:  No           cosentyx injection. Patient has no medication on hand. Next dose is due 3/13.      SHIPPING     Shipping address confirmed in Epic.     Delivery Scheduled: Yes, Expected medication delivery date: 030620.     Medication will be delivered via Same Day Courier to the home address in Epic [place]. Teresa Sloan   [facility] Specialty Technician

## 2018-05-26 MED FILL — COSENTYX PEN 300 MG/2 PENS (150 MG/ML) SUBCUTANEOUS: 28 days supply | Qty: 2 | Fill #3

## 2018-05-26 MED FILL — COSENTYX PEN 300 MG/2 PENS (150 MG/ML) SUBCUTANEOUS: 28 days supply | Qty: 2 | Fill #3 | Status: AC

## 2018-05-29 ENCOUNTER — Encounter
Admit: 2018-05-29 | Discharge: 2018-05-29 | Disposition: A | Discharge status: Home / Self Care | Payer: BLUE CROSS/BLUE SHIELD

## 2018-05-29 ENCOUNTER — Ambulatory Visit
Admit: 2018-05-29 | Discharge: 2018-05-29 | Disposition: A | Discharge status: Home / Self Care | Payer: BLUE CROSS/BLUE SHIELD

## 2018-05-29 DIAGNOSIS — L409 Psoriasis, unspecified: Principal | ICD-10-CM

## 2018-05-29 DIAGNOSIS — E669 Obesity, unspecified: Principal | ICD-10-CM

## 2018-05-29 DIAGNOSIS — F329 Major depressive disorder, single episode, unspecified: Principal | ICD-10-CM

## 2018-05-29 DIAGNOSIS — I10 Essential (primary) hypertension: Principal | ICD-10-CM

## 2018-05-29 DIAGNOSIS — F419 Anxiety disorder, unspecified: Principal | ICD-10-CM

## 2018-05-29 DIAGNOSIS — K219 Gastro-esophageal reflux disease without esophagitis: Principal | ICD-10-CM

## 2018-05-29 DIAGNOSIS — S0993XA Unspecified injury of face, initial encounter: Principal | ICD-10-CM

## 2018-05-29 DIAGNOSIS — Z041 Encounter for examination and observation following transport accident: Principal | ICD-10-CM

## 2018-05-29 DIAGNOSIS — M1990 Unspecified osteoarthritis, unspecified site: Principal | ICD-10-CM

## 2018-05-29 DIAGNOSIS — Z79899 Other long term (current) drug therapy: Principal | ICD-10-CM
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
## 2018-06-15 DIAGNOSIS — F419 Anxiety disorder, unspecified: Principal | ICD-10-CM

## 2018-06-15 DIAGNOSIS — K219 Gastro-esophageal reflux disease without esophagitis: Principal | ICD-10-CM

## 2018-06-15 DIAGNOSIS — S8990XA Unspecified injury of unspecified lower leg, initial encounter: Principal | ICD-10-CM

## 2018-06-15 DIAGNOSIS — M2550 Pain in unspecified joint: Principal | ICD-10-CM

## 2018-06-15 DIAGNOSIS — M1990 Unspecified osteoarthritis, unspecified site: Principal | ICD-10-CM

## 2018-06-15 DIAGNOSIS — F329 Major depressive disorder, single episode, unspecified: Principal | ICD-10-CM

## 2018-06-15 DIAGNOSIS — L409 Psoriasis, unspecified: Principal | ICD-10-CM

## 2018-06-15 DIAGNOSIS — I10 Essential (primary) hypertension: Principal | ICD-10-CM

## 2018-06-15 NOTE — Unmapped (Signed)
06/15/18    Travel Screening Questions Completed.    Travel Screening Questions/Answers:  1). Have you traveled within the last 14 days?: No  2). Do you have new or worsening respiratory symptoms (e.g. cough, difficulty breathing)?: No  3). Have you had close contact with a person with confirmed COVID-19 in the last 14 days before symptoms began?: No      Negative Travel Screen: Patient answered NO to questions 2 and 3. Proceed with current scheduling protocol for your clinic.       The call was handled in the following manner: Documented patient response and encounter closed    Spoke with patient, Confirmed appointment on 3/27. Travel screening complete, notified of visitor policy.

## 2018-06-15 NOTE — Unmapped (Signed)
[facility] Refill Coordination Note    Specialty Medication(s) to be Shipped:   Inflammatory Disorders: Cosentyx    Other medication(s) to be shipped: Teresa Sloan, DOB: 1955/12/11  Phone: [phone] (home)       All above HIPAA information was verified with patient.     Completed refill call assessment today to schedule patient's medication shipment from the [facility] [phone]).       Specialty medication(s) and dose(s) confirmed: Regimen is correct and unchanged.   Changes to medications: Yari reports no changes reported at this time.  Changes to insurance: No  Questions for the pharmacist: No    Confirmed patient received Welcome Packet with first shipment. The patient will receive a drug information handout for each medication shipped and additional FDA Medication Guides as required.       DISEASE/MEDICATION-SPECIFIC INFORMATION        N/A    SPECIALTY MEDICATION ADHERENCE     Medication Adherence    Patient reported X missed doses in the last month:  0  Specialty Medication:  COSENTYX  Patient is on additional specialty medications:  No  Patient is on more than two specialty medications:  No  Any gaps in refill history greater than 2 weeks in the last 3 months:  no  Demonstrates understanding of importance of adherence:  yes  Informant:  patient  Reliability of informant:  reliable  Adherence tools used:  calendar  Confirmed plan for next specialty medication refill:  delivery by pharmacy  Refills needed for supportive medications:  not needed          Refill Coordination    Has the Patients' Contact Information Changed:  No  Is the Shipping Address Different:  No           Cosentyx injections. Patient has no medication on hand      SHIPPING     Shipping address confirmed in Epic.     Delivery Scheduled: Yes, Expected medication delivery date: 040320.     Medication will be delivered via Same Day Courier to the home address in Epic WAM.    Evanna Washinton D [facility] Specialty Technician

## 2018-06-16 ENCOUNTER — Ambulatory Visit: Admit: 2018-06-16 | Discharge: 2018-06-17 | Payer: BLUE CROSS/BLUE SHIELD

## 2018-06-16 DIAGNOSIS — S8990XA Unspecified injury of unspecified lower leg, initial encounter: Principal | ICD-10-CM

## 2018-06-16 DIAGNOSIS — L409 Psoriasis, unspecified: Principal | ICD-10-CM

## 2018-06-16 DIAGNOSIS — I10 Essential (primary) hypertension: Principal | ICD-10-CM

## 2018-06-16 DIAGNOSIS — F329 Major depressive disorder, single episode, unspecified: Principal | ICD-10-CM

## 2018-06-16 DIAGNOSIS — M1990 Unspecified osteoarthritis, unspecified site: Principal | ICD-10-CM

## 2018-06-16 DIAGNOSIS — M2550 Pain in unspecified joint: Principal | ICD-10-CM

## 2018-06-16 DIAGNOSIS — F419 Anxiety disorder, unspecified: Principal | ICD-10-CM

## 2018-06-16 DIAGNOSIS — L081 Erythrasma: Principal | ICD-10-CM

## 2018-06-16 DIAGNOSIS — L280 Lichen simplex chronicus: Principal | ICD-10-CM

## 2018-06-16 DIAGNOSIS — M25541 Pain in joints of right hand: Principal | ICD-10-CM

## 2018-06-16 DIAGNOSIS — K219 Gastro-esophageal reflux disease without esophagitis: Principal | ICD-10-CM

## 2018-06-16 MED ORDER — CLINDAMYCIN 1 % LOTION
Freq: Two times a day (BID) | TOPICAL | 2 refills | 0.00000 days | Status: CP
Start: 2018-06-16 — End: ?

## 2018-06-16 NOTE — Unmapped (Signed)
Assessment and Plan:    1. Erythrasma. The nature of this condition was reviewed. She was prescribed clindamycin (initially lotion, insurance will cover solution) to apply BID x 3-4 weeks until resolved.    2. 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 Will continue Cosentyx 300 mg  every 4 weeks. Next quantiferon gold due in December 2020.     3. Joint pain, likely osteoarthritis but would like to rule out psoriatic arthritis. Referral to rheumatology placed. Reviewed use of Tylenol and NSAIDs for likely osteoarthritis while waiting for this appointment.     Plan for RV in 6 months for psoriasis follow-up.     Chief Complaint: rash    HPI: Teresa Sloan is a pleasant 63 y.o. female who was last seen in our clinic on 03/03/2018. At that time we discussed a diagnosis of psoriasis with 30% TBSA and recommended starting Cosentyx. Overall, she is very happy with the status of her psoriasis and feels that this largely clear. She has not required any topical steroids for this in several months. She is concerned about joint pain of her right index finger. She does not know if this is any better since being on Cosentyx.     Today she has questions about a new rash in her right axilla, lower abdomen, and medial thighs. This has been present since December. This is itchy and burning. She feels that this has a foul odor. She tried treating this as psoriasis with triamcinolone and clobetasol without improvement.         No other new, changing, or symptomatic lesions of concern are reported today.    Pertinent Past Medical History:  Psoriasis on Cosentyx -- well controlled; last quanitferon gold negative in December 2019    Medications:    Current Outpatient Medications on File Prior to Visit   Medication Sig Dispense Refill   ??? clobetasol (TEMOVATE) 0.05 % ointment Apply to thick areas of psoriasis twice daily as needed. Avoid face and skin folds 120 g 3   ??? empty container (SHARPS-A-GATOR DISPOSAL SYSTEM) Misc use to dispose of needles 1 each 2   ??? folic acid (FOLVITE) 1 MG tablet Take 1 tablet (1 mg total) by mouth daily. 100 tablet 3   ??? secukinumab (COSENTYX) 150 mg/mL PnIj injection inject the contents of 2 pens (300 mg) udner the skin once weekly at weeks 0, 1, 2, 3, and 4. then inject the contents of 2 pens (300 mg) every 4 weeks 14 mL 0   ??? triamcinolone (KENALOG) 0.1 % ointment Apply to thin areas of psoriasis twice daily as needed 454 g 6   ??? amLODIPine (NORVASC) 10 MG tablet Take 1 tablet (10 mg total) by mouth daily. 30 tablet 11   ??? ciclopirox (PENLAC) 8 % solution Apply over nail and surrounding skin. Apply daily over previous coat. After seven (7) days, may remove with alcohol and continue cycle. (Patient not taking: Reported on 03/03/2018) 6.6 mL 1   ??? hydroCHLOROthiazide (HYDRODIURIL) 25 MG tablet Take 1 tablet (25 mg total) by mouth daily. (Patient not taking: Reported on 06/15/2018) 30 tablet 11   ??? methocarbamol (ROBAXIN) 750 MG tablet Take 750 mg by mouth Four (4) times a day.     ??? secukinumab (COSENTYX PEN, 2 PENS,) 150 mg/mL PnIj injection Inject the contents of 2 pens (300 mg) under the skin once weekly at weeks 0, 1, 2, 3, and 4. Then  inject the contents of 2 pens (300 mg) every 4 weeks. (Patient not taking: Reported on 06/15/2018) 14 mL 0   ??? traMADol (ULTRAM) 50 mg tablet Take 50 mg by mouth Every six (6) hours.     ??? valACYclovir (VALTREX) 500 MG tablet Take 500 mg by mouth. Frequency:PHARMDIR   Dosage:500   MG  Instructions:  Note:take one tablet twice a day for three days whenever have outbreak of fever blisters Dose: 500MG        No current facility-administered medications on file prior to visit.        Allergies: Patient has no known allergies.    Review of Systems: Denies fevers, chills. No nausea, vomiting, diarrhea. No other skin complaints.     Physical Examination:  General: Well-developed, well-nourished, no acute distress.   Neuro : Alert and oriented, answers questions appropriately.  Skin: Per patient request, focused exam with inspection and palpation of the face, neck, back, chest, bilateral axillae, bilateral upper extremities, bilateral lower extremities, abdomen, external genitalia was performed today. Findings were normal with exception of the following:  There are brown very thin plaques with some fine scale in her right axilla, lower abdomen under pannus, and inguinal folds extending onto medial thighs. Wood's lamp positive for coral red fluorescence. There are lichenified slightly hyperpigmented plaques on her lower legs at the previous psoriasis sites. There are hyperpigmented patches on her back consistent with PIH at previous psoriasis sites.

## 2018-06-16 NOTE — Unmapped (Signed)
Patient called stating that pharmacy told her that the Povider needed to call medicaid to have them approve the medication

## 2018-06-16 NOTE — Unmapped (Signed)
Please apply clindamycin lotion to the areas in your right underarm, groin, and stomach twice daily for the next 3-4 weeks (until this has resolved). You can stop sooner if it has cleared.     Please continue Cosentyx 300 mg every 4 weeks.

## 2018-06-19 NOTE — Unmapped (Signed)
PA initiated for Clindamycin 1% Lotion via [employer], PA#: [id number]

## 2018-06-26 MED ORDER — COSENTYX PEN 300 MG/2 PENS (150 MG/ML) SUBCUTANEOUS
SUBCUTANEOUS | 6 refills | 0.00000 days | Status: CP
Start: 2018-06-26 — End: ?
  Filled 2018-06-27: qty 2, 28d supply, fill #0

## 2018-06-26 NOTE — Unmapped (Signed)
06/26/2018    Mollye called [facility] pharmacy reporting she did not receive shipment of Cosentyx from 06/23/18.  Medication requires refills from prescriber.  Refill requested and will call patient back to confirm a delivery date once new prescription is received.

## 2018-06-26 NOTE — Unmapped (Signed)
We have received the Cosentyx prescription and have spoken with the patient. The new delivery date agreed upon by the patient is 06/28/18 (next injection due 04/15 per pt).

## 2018-06-27 MED FILL — COSENTYX PEN 300 MG/2 PENS (150 MG/ML) SUBCUTANEOUS: 28 days supply | Qty: 2 | Fill #0 | Status: AC

## 2018-06-30 NOTE — Unmapped (Signed)
Received fax stating Clindamycin lotion was denied.

## 2018-07-11 NOTE — Unmapped (Signed)
Denial letter for Clindamycin lotion scanned into media

## 2018-07-18 NOTE — Unmapped (Signed)
[facility] Refill Coordination Note    Specialty Medication(s) to be Shipped:   Inflammatory Disorders: Cosentyx    Other medication(s) to be shipped: Teresa Sloan, DOB: 03-27-1955  Phone: [phone] (home)       All above HIPAA information was verified with patient.     Completed refill call assessment today to schedule patient's medication shipment from the [facility] [phone]).       Specialty medication(s) and dose(s) confirmed: Regimen is correct and unchanged.   Changes to medications: Teresa Sloan reports no changes at this time.  Changes to insurance: No  Questions for the pharmacist: No    Confirmed patient received Welcome Packet with first shipment. The patient will receive a drug information handout for each medication shipped and additional FDA Medication Guides as required.       DISEASE/MEDICATION-SPECIFIC INFORMATION        N/A    SPECIALTY MEDICATION ADHERENCE     Medication Adherence    Patient reported X missed doses in the last month:  0  Specialty Medication:  cosentyx 150 mg/ml syringes  Patient is on additional specialty medications:  No  Patient is on more than two specialty medications:  No  Any gaps in refill history greater than 2 weeks in the last 3 months:  no  Demonstrates understanding of importance of adherence:  yes  Informant:  patient  Reliability of informant:  reliable  Adherence tools used:  calendar  Confirmed plan for next specialty medication refill:  delivery by pharmacy  Refills needed for supportive medications:  not needed          Refill Coordination    Has the Patients' Contact Information Changed:  No  Is the Shipping Address Different:  No         cosentyx 150 mg/ml syringes. Pt has no medication on hand      SHIPPING     Shipping address confirmed in Epic.     Delivery Scheduled: Yes, Expected medication delivery date: 050720.     Medication will be delivered via Same Day Courier to the home address in Epic WAM.    Maicee Ullman D Naveya Ellerman   [facility] Specialty Technician

## 2018-07-27 MED FILL — COSENTYX PEN 300 MG/2 PENS (150 MG/ML) SUBCUTANEOUS: 28 days supply | Qty: 2 | Fill #1 | Status: AC

## 2018-07-27 MED FILL — COSENTYX PEN 300 MG/2 PENS (150 MG/ML) SUBCUTANEOUS: 28 days supply | Qty: 2 | Fill #1

## 2018-08-18 NOTE — Unmapped (Signed)
Teresa Sloan reports doing well on her Cosentyx. She still occasionally uses topical steroids around her ankles, and feels like this helps. 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 No issues identified.     [facility] Clinical Assessment & Refill Coordination Note    Teresa Sloan, DOB: 05-26-1955  Phone: [phone] (home)     All above HIPAA information was verified with patient.     Specialty Medication(s):   Inflammatory Disorders: Cosentyx     Current Outpatient Medications   Medication Sig Dispense Refill   ??? amLODIPine (NORVASC) 10 MG tablet Take 1 tablet (10 mg total) by mouth daily. 30 tablet 11   ??? ciclopirox (PENLAC) 8 % solution Apply over nail and surrounding skin. Apply daily over previous coat. After seven (7) days, may remove with alcohol and continue cycle. (Patient not taking: Reported on 03/03/2018) 6.6 mL 1   ??? clindamycin (CLEOCIN T) 1 % lotion Apply topically Two (2) times a day. To affected areas in armpit, groin, abdomen x 3-4 weeks, less if resolved 120 mL 2   ??? clobetasol (TEMOVATE) 0.05 % ointment Apply to thick areas of psoriasis twice daily as needed. Avoid face and skin folds 120 g 3   ??? empty container (SHARPS-A-GATOR DISPOSAL SYSTEM) Misc use to dispose of needles 1 each 2   ??? folic acid (FOLVITE) 1 MG tablet Take 1 tablet (1 mg total) by mouth daily. 100 tablet 3   ??? hydroCHLOROthiazide (HYDRODIURIL) 25 MG tablet Take 1 tablet (25 mg total) by mouth daily. (Patient not taking: Reported on 06/15/2018) 30 tablet 11   ??? methocarbamol (ROBAXIN) 750 MG tablet Take 750 mg by mouth Four (4) times a day.     ??? secukinumab (COSENTYX PEN, 2 PENS,) 150 mg/mL PnIj injection Inject the contents of 2 pens (300 mg) subcutaneously every 4 weeks 2 mL 6   ??? secukinumab (COSENTYX) 150 mg/mL PnIj injection inject the contents of 2 pens (300 mg) udner the skin once weekly at weeks 0, 1, 2, 3, and 4. then inject the contents of 2 pens (300 mg) every 4 weeks 14 mL 0   ??? traMADol (ULTRAM) 50 mg tablet Take 50 mg by mouth Every six (6) hours.     ??? triamcinolone (KENALOG) 0.1 % ointment Apply to thin areas of psoriasis twice daily as needed 454 g 6   ??? valACYclovir (VALTREX) 500 MG tablet Take 500 mg by mouth. Frequency:PHARMDIR   Dosage:500   MG  Instructions:  Note:take one tablet twice a day for three days whenever have outbreak of fever blisters Dose: 500MG        No current facility-administered medications for this visit.         Changes to medications: Taesha reports no changes at this time.    No Known Allergies    Changes to allergies: No    SPECIALTY MEDICATION ADHERENCE     Cosentyx - 0 left    Medication Adherence    Patient reported X missed doses in the last month:  0  Specialty Medication:  Cosentyx  Adherence tools used:  calendar          Specialty medication(s) dose(s) confirmed: Regimen is correct and unchanged.     Are there any concerns with adherence? No    Adherence counseling provided? Not needed    CLINICAL MANAGEMENT AND INTERVENTION      Clinical Benefit Assessment:  Do you feel the medicine is effective or helping your condition? Yes    Clinical Benefit counseling provided? Not needed    Adverse Effects Assessment:    Are you experiencing any side effects? No    Are you experiencing difficulty administering your medicine? No    Quality of Life Assessment:    How many days over the past month did your psoriasis  keep you from your normal activities? For example, brushing your teeth or getting up in the morning. 0    Have you discussed this with your provider? Not needed    Therapy Appropriateness:    Is therapy appropriate? Yes, therapy is appropriate and should be continued    DISEASE/MEDICATION-SPECIFIC INFORMATION      For patients on injectable medications: Patient currently has 0 doses left.  Next injection is scheduled for June 14.    PATIENT SPECIFIC NEEDS     ? Does the patient have any physical, cognitive, or cultural barriers? No    ? Is the patient high risk? No     ? Does the patient require a Care Management Plan? No     ? Does the patient require physician intervention or other additional services (i.e. nutrition, smoking cessation, social work)? No      SHIPPING     Specialty Medication(s) to be Shipped:   Inflammatory Disorders: Cosentyx    Other medication(s) to be shipped: na     Changes to insurance: No    Delivery Scheduled: Yes, Expected medication delivery date: Friday, June 5.     Medication will be delivered via Same Day Courier to the confirmed home address in [facility].    The patient will receive a drug information handout for each medication shipped and additional FDA Medication Guides as required.  Verified that patient has previously received a Conservation officer, historic buildings.    All of the patient's questions and concerns have been addressed.    Teresa Sloan   [facility] Specialty Pharmacist

## 2018-08-25 MED FILL — COSENTYX PEN 300 MG/2 PENS (150 MG/ML) SUBCUTANEOUS: 28 days supply | Qty: 2 | Fill #2

## 2018-08-25 MED FILL — COSENTYX PEN 300 MG/2 PENS (150 MG/ML) SUBCUTANEOUS: 28 days supply | Qty: 2 | Fill #2 | Status: AC

## 2018-09-11 ENCOUNTER — Encounter: Admit: 2018-09-11 | Discharge: 2018-09-12 | Payer: BLUE CROSS/BLUE SHIELD

## 2018-09-11 DIAGNOSIS — Z1231 Encounter for screening mammogram for malignant neoplasm of breast: Principal | ICD-10-CM

## 2018-09-20 NOTE — Unmapped (Signed)
[facility] Refill Coordination Note    Specialty Medication(s) to be Shipped:   Inflammatory Disorders: Cosentyx    Other medication(s) to be shipped: Teresa Sloan, DOB: 04-Aug-1955  Phone: [phone] (home)       All above HIPAA information was verified with patient.     Completed refill call assessment today to schedule patient's medication shipment from the [facility] [phone]).       Specialty medication(s) and dose(s) confirmed: Regimen is correct and unchanged.   Changes to medications: Laneshia reports no changes at this time.  Changes to insurance: No  Questions for the pharmacist: No    Confirmed patient received Welcome Packet with first shipment. The patient will receive a drug information handout for each medication shipped and additional FDA Medication Guides as required.       DISEASE/MEDICATION-SPECIFIC INFORMATION        N/A    SPECIALTY MEDICATION ADHERENCE     Medication Adherence    Patient reported X missed doses in the last month:  0  Specialty Medication:  cosentyx 150 mg/ml  Patient is on additional specialty medications:  No  Patient is on more than two specialty medications:  No  Any gaps in refill history greater than 2 weeks in the last 3 months:  no  Demonstrates understanding of importance of adherence:  yes  Informant:  patient  Reliability of informant:  reliable  Adherence tools used:  calendar  Confirmed plan for next specialty medication refill:  delivery by pharmacy  Refills needed for supportive medications:  not needed                cosentyx 150 mg/ml. 0 on hand. Next dose is due 7/14      SHIPPING     Shipping address confirmed in Epic.     Delivery Scheduled: Yes, Expected medication delivery date: 070920.     Medication will be delivered via Same Day Courier to the home address in Epic WAM.    Jakin Pavao D Vertis Bauder   [facility] Specialty Technician

## 2018-09-28 MED FILL — COSENTYX PEN 300 MG/2 PENS (150 MG/ML) SUBCUTANEOUS: 28 days supply | Qty: 2 | Fill #3

## 2018-09-28 MED FILL — COSENTYX PEN 300 MG/2 PENS (150 MG/ML) SUBCUTANEOUS: 28 days supply | Qty: 2 | Fill #3 | Status: AC

## 2018-10-26 NOTE — Unmapped (Signed)
[facility] Refill Coordination Note    Specialty Medication(s) to be Shipped:   Inflammatory Disorders: Cosentyx    Other medication(s) to be shipped: LEIDY MASSAR, DOB: [birth year]  Phone: [phone] (home)       All above HIPAA information was verified with patient.     Completed refill call assessment today to schedule patient's medication shipment from the [facility] [phone]).       Specialty medication(s) and dose(s) confirmed: Regimen is correct and unchanged.   Changes to medications: Georgine reports no changes at this time.  Changes to insurance: No  Questions for the pharmacist: No    Confirmed patient received Welcome Packet with first shipment. The patient will receive a drug information handout for each medication shipped and additional FDA Medication Guides as required.       DISEASE/MEDICATION-SPECIFIC INFORMATION        For patients on injectable medications: Patient currently has 0 doses left.  Next injection is scheduled for [id number].    SPECIALTY MEDICATION ADHERENCE     Medication Adherence    Patient reported X missed doses in the last month: 0  Specialty Medication: cosentyx 150 mg/ml  Patient is on additional specialty medications: No  Patient is on more than two specialty medications: No  Any gaps in refill history greater than 2 weeks in the last 3 months: no  Demonstrates understanding of importance of adherence: yes  Informant: patient  Reliability of informant: reliable  Adherence tools used: calendar  Confirmed plan for next specialty medication refill: delivery by pharmacy  Refills needed for supportive medications: not needed                cosentyx 150 mg/ml. 0 on hand      SHIPPING     Shipping address confirmed in Epic.     Delivery Scheduled: Yes, Expected medication delivery date: 081120.     Medication will be delivered via Same Day Courier to the home address in Epic WAM.    Gurkaran Rahm D Gemini Beaumier   [facility] Specialty Technician

## 2018-10-31 MED FILL — COSENTYX PEN 300 MG/2 PENS (150 MG/ML) SUBCUTANEOUS: 28 days supply | Qty: 2 | Fill #4

## 2018-10-31 MED FILL — COSENTYX PEN 300 MG/2 PENS (150 MG/ML) SUBCUTANEOUS: 28 days supply | Qty: 2 | Fill #4 | Status: AC

## 2018-11-22 NOTE — Unmapped (Signed)
[facility] Refill Coordination Note    Specialty Medication(s) to be Shipped:   Inflammatory Disorders: Cosentyx    Other medication(s) to be shipped: Teresa Sloan, DOB: 1956/02/17  Phone: [phone] (home)       All above HIPAA information was verified with patient.     Completed refill call assessment today to schedule patient's medication shipment from the [facility] [phone]).       Specialty medication(s) and dose(s) confirmed: Regimen is correct and unchanged.   Changes to medications: Teresa Sloan reports no changes at this time.  Changes to insurance: No  Questions for the pharmacist: No    Confirmed patient received Welcome Packet with first shipment. The patient will receive a drug information handout for each medication shipped and additional FDA Medication Guides as required.       DISEASE/MEDICATION-SPECIFIC INFORMATION        For patients on injectable medications: Patient currently has 0 doses left.  Next injection is scheduled for [id number].    SPECIALTY MEDICATION ADHERENCE     Medication Adherence    Patient reported X missed doses in the last month: 0  Specialty Medication: cosentyx 150 mg/ml  Patient is on additional specialty medications: No  Patient is on more than two specialty medications: No  Any gaps in refill history greater than 2 weeks in the last 3 months: no  Demonstrates understanding of importance of adherence: yes  Informant: patient  Reliability of informant: reliable  Adherence tools used: calendar  Confirmed plan for next specialty medication refill: delivery by pharmacy  Refills needed for supportive medications: not needed                cosentyx 150 mg/ml. 0 on hand      SHIPPING     Shipping address confirmed in Epic.     Delivery Scheduled: Yes, Expected medication delivery date: 091120.     Medication will be delivered via Same Day Courier to the home address in Epic WAM.    Farah Benish D Malosi Hemstreet   [facility] Specialty Technician

## 2018-12-01 MED FILL — COSENTYX PEN 300 MG/2 PENS (150 MG/ML) SUBCUTANEOUS: 28 days supply | Qty: 2 | Fill #5

## 2018-12-01 MED FILL — COSENTYX PEN 300 MG/2 PENS (150 MG/ML) SUBCUTANEOUS: 28 days supply | Qty: 2 | Fill #5 | Status: AC

## 2018-12-07 ENCOUNTER — Ambulatory Visit: Admit: 2018-12-07 | Discharge: 2018-12-07 | Payer: BLUE CROSS/BLUE SHIELD

## 2018-12-07 ENCOUNTER — Encounter: Admit: 2018-12-07 | Discharge: 2018-12-07 | Payer: BLUE CROSS/BLUE SHIELD

## 2018-12-07 ENCOUNTER — Encounter
Admit: 2018-12-07 | Discharge: 2018-12-07 | Payer: BLUE CROSS/BLUE SHIELD | Attending: Rheumatology | Primary: Rheumatology

## 2018-12-07 DIAGNOSIS — M19042 Primary osteoarthritis, left hand: Secondary | ICD-10-CM

## 2018-12-07 DIAGNOSIS — I10 Essential (primary) hypertension: Secondary | ICD-10-CM

## 2018-12-07 DIAGNOSIS — M19041 Primary osteoarthritis, right hand: Secondary | ICD-10-CM

## 2018-12-07 DIAGNOSIS — M2550 Pain in unspecified joint: Secondary | ICD-10-CM

## 2018-12-07 DIAGNOSIS — M25572 Pain in left ankle and joints of left foot: Secondary | ICD-10-CM

## 2018-12-07 DIAGNOSIS — L4050 Arthropathic psoriasis, unspecified: Secondary | ICD-10-CM

## 2018-12-07 DIAGNOSIS — L409 Psoriasis, unspecified: Secondary | ICD-10-CM

## 2018-12-07 DIAGNOSIS — E669 Obesity, unspecified: Secondary | ICD-10-CM

## 2018-12-07 DIAGNOSIS — M25571 Pain in right ankle and joints of right foot: Secondary | ICD-10-CM

## 2018-12-07 DIAGNOSIS — Z79899 Other long term (current) drug therapy: Secondary | ICD-10-CM

## 2018-12-07 DIAGNOSIS — M25542 Pain in joints of left hand: Secondary | ICD-10-CM

## 2018-12-07 DIAGNOSIS — M25541 Pain in joints of right hand: Secondary | ICD-10-CM

## 2018-12-07 LAB — COMPREHENSIVE METABOLIC PANEL
ALBUMIN: 3.8 g/dL (ref 3.5–5.0)
ALKALINE PHOSPHATASE: 115 U/L (ref 38–126)
ALT (SGPT): 17 U/L (ref <35)
ANION GAP: 7 mmol/L (ref 7–15)
AST (SGOT): 23 U/L (ref 14–38)
BILIRUBIN TOTAL: 0.5 mg/dL (ref 0.0–1.2)
BLOOD UREA NITROGEN: 18 mg/dL (ref 7–21)
BUN / CREAT RATIO: 23
CALCIUM: 10 mg/dL (ref 8.5–10.2)
CHLORIDE: 107 mmol/L (ref 98–107)
CO2: 29 mmol/L (ref 22.0–30.0)
CREATININE: 0.78 mg/dL (ref 0.60–1.00)
EGFR CKD-EPI AA FEMALE: >90 mL/min/{1.73_m2} (ref >=60)
EGFR CKD-EPI NON-AA FEMALE: 81 mL/min/{1.73_m2} (ref >=60)
GLUCOSE RANDOM: 88 mg/dL (ref 70–179)
POTASSIUM: 4.4 mmol/L (ref 3.5–5.0)
PROTEIN TOTAL: 7 g/dL (ref 6.5–8.3)

## 2018-12-07 LAB — CBC W/ AUTO DIFF
BASOPHILS ABSOLUTE COUNT: 0 10*9/L (ref 0.0–0.1)
BASOPHILS RELATIVE PERCENT: 0.6 %
EOSINOPHILS ABSOLUTE COUNT: 0.3 10*9/L (ref 0.0–0.4)
EOSINOPHILS RELATIVE PERCENT: 4.4 %
HEMATOCRIT: 37.6 % (ref 36.0–46.0)
HEMOGLOBIN: 12.8 g/dL — ABNORMAL LOW (ref 13.5–16.0)
LARGE UNSTAINED CELLS: 2 % (ref 0–4)
MEAN CORPUSCULAR HEMOGLOBIN: 29.9 pg (ref 26.0–34.0)
MEAN CORPUSCULAR VOLUME: 87.7 fL (ref 80.0–100.0)
MEAN PLATELET VOLUME: 7 fL (ref 7.0–10.0)
MONOCYTES ABSOLUTE COUNT: 0.5 10*9/L (ref 0.2–0.8)
MONOCYTES RELATIVE PERCENT: 6 %
NEUTROPHILS ABSOLUTE COUNT: 3.9 10*9/L (ref 2.0–7.5)
NEUTROPHILS RELATIVE PERCENT: 53.1 %
PLATELET COUNT: 259 10*9/L (ref 150–440)
RED BLOOD CELL COUNT: 4.28 10*12/L (ref 4.00–5.20)
RED CELL DISTRIBUTION WIDTH: 14.1 % (ref 12.0–15.0)
WBC ADJUSTED: 7.4 10*9/L (ref 4.5–11.0)

## 2018-12-07 LAB — LARGE UNSTAINED CELLS: Lab: 2

## 2018-12-07 LAB — ERYTHROCYTE SEDIMENTATION RATE: Lab: 35 — ABNORMAL HIGH

## 2018-12-07 LAB — C-REACTIVE PROTEIN
C reactive protein:MCnc:Pt:Ser/Plas:Qn:: 14.7 — ABNORMAL HIGH
C-REACTIVE PROTEIN: 14.7 mg/L — ABNORMAL HIGH (ref <10.0)

## 2018-12-07 LAB — SODIUM: Sodium:SCnc:Pt:Ser/Plas:Qn:: 143

## 2018-12-07 NOTE — Unmapped (Signed)
RHEUMATOLOGY NEW Telehealth VISIT NOTE    Assessment/Plan:   Teresa Sloan is a 63 y.o. 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     Teresa Sloan reports of joint pain and swelling raise concern for Psoriatic Arthritis, however this is difficult to fully assess over the phone without a physical exam to evaluate for synovitis. Her history of pain worsening with activity, limited AM stiffness, and DIP joint involvement could also be suggestive of Osteoarthritis. She has previously had evidence of synovitis per Rheumatology notes from 2006 and was suspected of having PsA at that time. She has been maintained on MTX therapy for many years, with limited improvement in her skin disease, but it is very possible this was keeping her joint symptoms under control. She discontinued MTX in Dec 2019 just before the start of her worsening joint symptoms.     Will plan to get X-Rays of the hands, feet, and SI joints to assess for evidence of PsA vs. OA. Will also update basic labs today in anticipation of possible need to re-start MTX if X-Rays suggestive of PsA. Discussed with patient that if her X-Rays show evidence of just OA, treatment options would instead be aimed at symptomatic relief with NSAIDs, Tylenol, weight loss, exercise/PT. We will reach out to her when results of below work up return to finalize plan.     Orders Placed This Encounter   Procedures   ??? XR Hand 3 Or More Views Bilateral   ??? XR Foot 3 Or More Views Bilateral   ??? XR Sacroiliac Joints 3 or More Views   ??? CRP  C-Reactive Protein   ??? ESR Sed rate   ??? CBC w/ Differential   ??? Comprehensive Metabolic Panel     We appreciate the opportunity to participate in the care of this patient. Total duration of the visit was 30 minutes , and > than 50% of the visit is spent in face-to-face counselling focusing on diagnosis and therapeutic options.     I spent 30 minutes on the phone with the patient. I spent an additional 5 minutes on pre- and post-visit activities.     The patient was physically located in [place] or a state in which I am permitted to provide care. The patient and/or parent/guardian understood that s/he may incur co-pays and cost sharing, and agreed to the telemedicine visit. The visit was reasonable and appropriate under the circumstances given the patient's presentation at the time.    The patient and/or parent/guardian has been advised of the potential risks and limitations of this mode of treatment (including, but not limited to, the absence of in-person examination) and has agreed to be treated using telemedicine. The patient's/patient's family's questions regarding telemedicine have been answered.     If the visit was completed in an ambulatory setting, the patient and/or parent/guardian has also been advised to contact their provider???s office for worsening conditions, and seek emergency medical treatment and/or call 911 if the patient deems either necessary.        History of Present Illness:     The patient was seen in consultation at the request of [facility] for the evaluation of joint pain in the setting of a history of long-standing Plaque Psoriasis.    Identification: Pt self identified using name and date of birth  Patient location: [place], [place]    The limitations of this telemedicine encounter were discussed with patient. Both the patient and myself agreed to this encounter despite these limitations. Benefits of this telemedicine encounter included allowing for continued care of patient and minimizing risk of exposure to COVID-19. Patient also aware that this is a billable encounter with possible copay.     Primary Care Provider: Rayvon Char, MD    Chief Complaint: Joint Pain    Patient presents via telephone for a new patient encounter.    HPI:  Teresa Sloan is a 63 y.o.  female with a history of HTN, Plaque Psoriasis (currently followed by Jamelle Haring on Cosentyx since 02/2018), and Obesity who presents for evaluation of a 6 month history of joint pain and swelling.    Teresa Sloan reports she was diagnosed with Psoriasis when she was 63yo. She has followed regularly with a Dermatologist. Last saw derm in 05/2018 who manages her Plaque Psoriasis. She has been treated in the past with MTX, with limited benefit for her skin disease. In December 2019 she was switched to Cosentyx, and reports it has been miraculous for her skin and has cleared up most of her areas of psoriasis.     She reports however that over the last 6 months she has noticed swelling and pain in the small joints of her fingers, in particular the knuckles closest to her fingernails most prominent on her R hand but also some involvement of her left hand. Her 2nd finger on her right hand may have had some redness and warmth at one point as well. She also has noticed swelling in her ankles, with pain that starts in her ankles and shoots up the back of her leg. Pain in her ankles is localized to the posterior aspect of her ankle. She has had times where she can almost not walk due to the pain and swelling in her ankles. She says the pain and stiffness in her joints are always present, her hands may be a bit more painful in the morning. Pain worsens with activity. Also has noticed new lower back pain over the last three months as well. She has tried taking advil but this does not help the pain.     On review of her records, she was evaluated by [facility] in 2006 with Dr. Luster Landsberg, who at that time felt she had Synovitis of b/l IP joints and suspected she had underlying Psoriatic Arthritis. He performed intraarticular steroid injections and recommended continuing MTX. She was not seen again at [facility] for further follow up with Rheumatology.     ROS:  Denies dry eyes/mouth, nasal or oral ulcers, chest pain on exertion, SOB, fatigue/malaise, abdominal pain, diarrhea/constipation, hematochezia, urinary frequency, dysuria, hematuria. Endorses Has.    Allergies:  Patient has no known allergies.    Medications:   Outpatient Medications Prior to Visit   Medication Sig Dispense Refill   ??? amLODIPine (NORVASC) 10 MG tablet Take 1 tablet (10 mg total) by mouth daily. 30 tablet 11   ??? ciclopirox (PENLAC) 8 % solution Apply over nail and surrounding skin. Apply daily over previous coat. After seven (7) days, may remove with alcohol and continue cycle. (Patient not taking: Reported on 03/03/2018) 6.6 mL 1   ??? clindamycin (CLEOCIN T) 1 % lotion Apply topically Two (2) times a day. To affected areas in armpit, groin, abdomen x 3-4 weeks, less if resolved 120 mL 2   ??? clobetasol (TEMOVATE) 0.05 %  ointment Apply to thick areas of psoriasis twice daily as needed. Avoid face and skin folds 120 g 3   ??? empty container (SHARPS-A-GATOR DISPOSAL SYSTEM) Misc use to dispose of needles 1 each 2   ??? hydroCHLOROthiazide (HYDRODIURIL) 25 MG tablet Take 1 tablet (25 mg total) by mouth daily. (Patient not taking: Reported on 06/15/2018) 30 tablet 11   ??? methocarbamol (ROBAXIN) 750 MG tablet Take 750 mg by mouth Four (4) times a day.     ??? secukinumab (COSENTYX PEN, 2 PENS,) 150 mg/mL PnIj injection Inject the contents of 2 pens (300 mg) subcutaneously every 4 weeks 2 mL 6   ??? secukinumab (COSENTYX) 150 mg/mL PnIj injection inject the contents of 2 pens (300 mg) udner the skin once weekly at weeks 0, 1, 2, 3, and 4. then inject the contents of 2 pens (300 mg) every 4 weeks 14 mL 0   ??? triamcinolone (KENALOG) 0.1 % ointment Apply to thin areas of psoriasis twice daily as needed 454 g 6   ??? valACYclovir (VALTREX) 500 MG tablet Take 500 mg by mouth. Frequency:PHARMDIR   Dosage:500   MG  Instructions:  Note:take one tablet twice a day for three days whenever have outbreak of fever blisters Dose: 500MG      ??? traMADol (ULTRAM) 50 mg tablet Take 50 mg by mouth Every six (6) hours.       No facility-administered medications prior to visit.      Medical History:  Past Medical History:   Diagnosis Date   ??? Anxiety    ??? Arthritis    ??? Depression    ??? GERD (gastroesophageal reflux disease)    ??? Hypertension    ??? Injury of posterior cruciate ligament    ??? Joint pain    ??? Psoriasis      Surgical History:  Past Surgical History:   Procedure Laterality Date   ??? HIP SURGERY     ??? KNEE SURGERY       Social History:  Social History     Tobacco Use   ??? Smoking status: Never Smoker   ??? Smokeless tobacco: Never Used   Substance Use Topics   ??? Alcohol use: No     Alcohol/week: 0.0 standard drinks   ??? Drug use: No   Lives alone in [place]. On diasability and not working currently. Never smoker. No alcohol use.     Family History:  Family History   Problem Relation Age of Onset   ??? Cancer Father    ??? Breast cancer Mother    ??? Cancer Sister    ??? Breast cancer Sister    ??? Breast cancer Maternal Aunt    ??? Melanoma Neg Hx    ??? Basal cell carcinoma Neg Hx    ??? Squamous cell carcinoma Neg Hx      No family history of autoimmune disease including RA, lupus, Spondyloarthritis, Psoriasis/Psoriatic arthritis. No history of a family member needing a joint replacement.     Review of Systems: Please see above in the HPI, the remainder of a 10-system review was unremarkable.    Review of outside records: I have reviewed available records through Epic.    Objective   Exam limited given telephone nature of the encounter    Gen: Does not sound to be in any acute distress. Conversant.  Pulm: Speaking in full sentences with no audible evidence of increased WOB.  Psych: AAO to person, place, and time.

## 2018-12-14 DIAGNOSIS — L4050 Arthropathic psoriasis, unspecified: Secondary | ICD-10-CM

## 2018-12-14 MED ORDER — METHOTREXATE SODIUM 2.5 MG TABLET
ORAL_TABLET | ORAL | 1 refills | 84.00000 days | Status: CP
Start: 2018-12-14 — End: ?

## 2018-12-14 MED ORDER — FOLIC ACID 1 MG TABLET
ORAL_TABLET | Freq: Every day | ORAL | 1 refills | 90.00000 days | Status: CP
Start: 2018-12-14 — End: ?

## 2018-12-14 NOTE — Unmapped (Signed)
Reason for call:   Pt states she was speaking to someone abt lab results and would like to speak to that person.    Last ov: Visit date not found  Next ov: Visit date not found

## 2018-12-14 NOTE — Unmapped (Signed)
Called patient to discuss results below.  Overall reassuring/normal.  Xrays c/w OA. However, she does have mildly elevated inflammatory markers. These could be ass'd with age, weight, etc, but raise the possibility that there is some active psoriatic arthritis as well, given the time course of her worsening sx.  Discussed adding back MTX and she is agreeable, will do 10mg /week x 2 weeks and stay on 15mg /week.    Re-evaluate in 3 mos, will schedule with Delorise Shiner, and determine if this has improved her sx. If yes, continue and check labs, if not can d/c and treat for OA as discussed today and at visit.  Lynann Beaver MD [facility]  [facility]    Appointment on 12/07/2018   Component Date Value   ??? CRP 12/07/2018 14.7*   ??? Sed Rate 12/07/2018 35*   ??? Sodium 12/07/2018 143    ??? Potassium 12/07/2018 4.4    ??? Chloride 12/07/2018 107    ??? Anion Gap 12/07/2018 7    ??? CO2 12/07/2018 29.0    ??? BUN 12/07/2018 18    ??? Creatinine 12/07/2018 0.78    ??? BUN/Creatinine Ratio 12/07/2018 23    ??? EGFR CKD-EPI Non-African* 12/07/2018 81    ??? EGFR CKD-EPI African Ame* 12/07/2018 >90    ??? Glucose 12/07/2018 88    ??? Calcium 12/07/2018 10.0    ??? Albumin 12/07/2018 3.8    ??? Total Protein 12/07/2018 7.0    ??? Total Bilirubin 12/07/2018 0.5    ??? AST 12/07/2018 23    ??? ALT 12/07/2018 17    ??? Alkaline Phosphatase 12/07/2018 115    ??? WBC 12/07/2018 7.4    ??? RBC 12/07/2018 4.28    ??? HGB 12/07/2018 12.8*   ??? HCT 12/07/2018 37.6    ??? MCV 12/07/2018 87.7    ??? [facility] 12/07/2018 29.9    ??? MCHC 12/07/2018 34.1    ??? RDW 12/07/2018 14.1    ??? MPV 12/07/2018 7.0    ??? Platelet 12/07/2018 259    ??? Neutrophils % 12/07/2018 53.1    ??? Lymphocytes % 12/07/2018 33.7    ??? Monocytes % 12/07/2018 6.0    ??? Eosinophils % 12/07/2018 4.4    ??? Basophils % 12/07/2018 0.6    ??? Absolute Neutrophils 12/07/2018 3.9    ??? Absolute Lymphocytes 12/07/2018 2.5    ??? Absolute Monocytes 12/07/2018 0.5    ??? Absolute Eosinophils 12/07/2018 0.3    ??? Absolute Basophils 12/07/2018 0.0    ??? Large Unstained Cells 12/07/2018 2      Results for orders placed during the hospital encounter of 12/07/18   XR Hand 2 Views Bilateral    Narrative EXAM: XR HAND 2 VIEWS BILATERAL  DATE: 12/07/2018 3:32 PM  ACCESSION: [id number]  DICTATED: 12/07/2018 4:29 PM  INTERPRETATION LOCATION: [facility]    CLINICAL INDICATION: 63 years old Female with Arthralgia, unspecified joint  - L40.9 - Psoriasis - M25.50 - Arthralgia, unspecified joint      COMPARISON: None.    TECHNIQUE: PA, and [place]ard views of the hands.    FINDINGS:   A small calcification adjacent to the left index finger distal interphalangeal joint is likely degenerative, and there is osteophytosis with sclerosis at the right index finger distal interphalangeal joint.  There is no evidence for recent fracture, dislocation, lytic or blastic lesion or inflammatory or erosive arthropathy.  There is no soft tissue calcification.      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

## 2018-12-22 NOTE — Unmapped (Signed)
[facility] Refill Coordination Note    Specialty Medication(s) to be Shipped:   Inflammatory Disorders: Cosentyx    Other medication(s) to be shipped: TAKEYAH WIEMAN, DOB: 05-17-1955  Phone: [phone] (home)       All above HIPAA information was verified with patient.     Completed refill call assessment today to schedule patient's medication shipment from the [facility] [phone]).       Specialty medication(s) and dose(s) confirmed: Regimen is correct and unchanged.   Changes to medications: Venisa reports no changes at this time.  Changes to insurance: No  Questions for the pharmacist: No    Confirmed patient received Welcome Packet with first shipment. The patient will receive a drug information handout for each medication shipped and additional FDA Medication Guides as required.       DISEASE/MEDICATION-SPECIFIC INFORMATION        For patients on injectable medications: Patient currently has 0 doses left.  Next injection is scheduled for 101420.    SPECIALTY MEDICATION ADHERENCE     Medication Adherence    Patient reported X missed doses in the last month: 0  Specialty Medication: cosentyx 150 mg/ml  Patient is on additional specialty medications: No  Any gaps in refill history greater than 2 weeks in the last 3 months: no  Demonstrates understanding of importance of adherence: yes  Informant: patient  Reliability of informant: reliable  Adherence tools used: calendar  Confirmed plan for next specialty medication refill: delivery by pharmacy  Refills needed for supportive medications: not needed                cosentyx 150 mg/ml. 0 on hand      SHIPPING     Shipping address confirmed in Epic.     Delivery Scheduled: Yes, Expected medication delivery date: 101220.     Medication will be delivered via Same Day Courier to the home address in Epic WAM.    Austyn Seier D Nazaiah Navarrete   [facility] Specialty Technician

## 2019-01-01 ENCOUNTER — Encounter: Admit: 2019-01-01 | Discharge: 2019-01-02 | Payer: BLUE CROSS/BLUE SHIELD

## 2019-01-01 MED ORDER — AMMONIUM LACTATE 12 % LOTION: 1 | g | Freq: Every day | 6 refills | 0 days | Status: AC

## 2019-01-01 MED ORDER — ADAPALENE 0.3 % TOPICAL GEL: 1 | g | Freq: Every evening | 6 refills | 0 days | Status: AC

## 2019-01-01 MED ORDER — COSENTYX PEN 300 MG/2 PENS (150 MG/ML) SUBCUTANEOUS: mL | 6 refills | 0 days | Status: AC

## 2019-01-01 MED FILL — COSENTYX PEN 300 MG/2 PENS (150 MG/ML) SUBCUTANEOUS: 28 days supply | Qty: 2 | Fill #6 | Status: AC

## 2019-01-01 MED FILL — COSENTYX PEN 300 MG/2 PENS (150 MG/ML) SUBCUTANEOUS: 28 days supply | Qty: 2 | Fill #6

## 2019-01-01 NOTE — Unmapped (Signed)
Assessment and Plan:    1.  Axillary granular parakeratosis  -Differin gel.  Apply a very small amount to axillas every night  -Stop all unnecessary products to avoid retention hyperkeratosis    2. 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  - Will continue Cosentyx 300 mg [name] every 4 weeks.   -Last quantiferon gold negative in December 2019.     3.  Mild dryness of abdomen  -Recommended moisturizers  with Lac-Hydrin lotion after shower      Plan for RV in 6 months for psoriasis follow-up with Dr. Domenic Schwab      Chief Complaint: Follow-up of psoriasis    HPI: Teresa Sloan is  63 y.o. female who was last seen by Dr. Domenic Schwab on 05/2018.  She has been on Cosentyx 300 mg subcutaneous every 4 weeks since 02/2018 with excellent results.  She refers she is completely clear and is very happy about the control of her disease.  She is tolerating medication well.  Denies recent infections, hospitalizations, cancers.  She was recently started on methotrexate 10 mg weekly by rheumatology to to her arthritis which could be a combination of psoriatic and osteoarthritis.      At last visit she was also diagnosed with erythrasma of the axillas.  She refers she has been using clindamycin solution with no much improvement.  She continues to develop brownish discoloration on her axillas and abdominal area and complains of foul odor.    No other new, changing, or symptomatic lesions of concern are reported today.    Pertinent Past Medical History:  Psoriasis on Cosentyx -- well controlled; last quanitferon gold negative in December 2019    Medications-reviewed in epic    Allergies: Patient has no known allergies.    Review of Systems: Denies fevers, chills, No nausea, vomiting, diarrhea.  Complains about joint pains.  No other skin complaints.     Physical Examination:  General: Well-developed, well-nourished, no acute distress. Neuro : Alert and oriented, answers questions appropriately.  Skin: Per patient request, focused exam with inspection and palpation of the neck,  chest, bilateral axillae, bilateral upper extremities, abdomen was performed today. Findings were normal with exception of the following:  -Localized to both axillas presents brownish hyperkeratotic papules coalescing into plaques.  -Abdomen present mild scaliness   -There are hyperpigmented patches on her back consistent with PIH at previous psoriasis sites.

## 2019-01-01 NOTE — Unmapped (Signed)
Axillary granular parakeratosis- use differin gel, small amount at night. Can use deodorant but no other products.

## 2019-01-24 DIAGNOSIS — R234 Changes in skin texture: Principal | ICD-10-CM

## 2019-01-24 MED ORDER — ADAPALENE 0.1 % TOPICAL GEL
Freq: Every evening | TOPICAL | 5 refills | 0.00000 days | Status: CP
Start: 2019-01-24 — End: ?

## 2019-01-24 NOTE — Unmapped (Addendum)
11/5 Update on intervention below: [employer], asked them to process Differin for [facility] name, product covered. Informed patient. Responded to Dr. Dub Mikes that med now covered, and d/c'd our prescription that was send for 0.1%. Patient was appreciative.     Time: 10 mins.    Teresa Sloan    ------------------------------------------------  Teresa Sloan is doing well, and feels her psoriasis is under control. She restarted methotrexate for arthritis symptoms, but has only been taking 4 tabs/weekly (her script indicates she should be taking 6), so we discussed directions. She denied any side effects, and she's been taking her folic acid daily.     She was unable to get her diferin at her local pharmacy due to cost - will reach out to Dr. Dub Mikes for assistance/thoughts on alternative agent (See details below in intervention).     [facility] Clinical Assessment & Refill Coordination Note    Teresa Sloan, DOB: 27-Feb-1956  Phone: [phone] (home)     All above HIPAA information was verified with patient.     Specialty Medication(s):   Inflammatory Disorders: Cosentyx     Current Outpatient Medications   Medication Sig Dispense Refill   ??? adapalene 0.3 % gel Apply 1 application topically nightly. On axillas 45 g 6   ??? amLODIPine (NORVASC) 10 MG tablet Take 1 tablet (10 mg total) by mouth daily. 30 tablet 11   ??? ammonium lactate (LAC-HYDRIN) 12 % lotion Apply 1 application topically once daily. 400 g 6   ??? ciclopirox (PENLAC) 8 % solution Apply over nail and surrounding skin. Apply daily over previous coat. After seven (7) days, may remove with alcohol and continue cycle. 6.6 mL 1   ??? clindamycin (CLEOCIN T) 1 % lotion Apply topically Two (2) times a day. To affected areas in armpit, groin, abdomen x 3-4 weeks, less if resolved 120 mL 2   ??? clobetasol (TEMOVATE) 0.05 % ointment Apply to thick areas of psoriasis twice daily as needed. Avoid face and skin folds 120 g 3 ??? empty container (SHARPS-A-GATOR DISPOSAL SYSTEM) Misc use to dispose of needles 1 each 2   ??? folic acid (FOLVITE) 1 MG tablet Take 1 tablet (1 mg total) by mouth daily. 90 tablet 1   ??? hydroCHLOROthiazide (HYDRODIURIL) 25 MG tablet Take 1 tablet (25 mg total) by mouth daily. 30 tablet 11   ??? methocarbamol (ROBAXIN) 750 MG tablet Take 750 mg by mouth Four (4) times a day.     ??? methotrexate 2.5 MG tablet Take 6 tablets (15 mg total) by mouth once a week. (start 4 tablets/week for the first 2 weeks) 72 tablet 1   ??? secukinumab (COSENTYX PEN, 2 PENS,) 150 mg/mL PnIj injection Inject the contents of 2 pens (300 mg) under the skin every 4 weeks. 2 mL 6   ??? secukinumab (COSENTYX) 150 mg/mL PnIj injection inject the contents of 2 pens (300 mg) udner the skin once weekly at weeks 0, 1, 2, 3, and 4. then inject the contents of 2 pens (300 mg) every 4 weeks 14 mL 0   ??? triamcinolone (KENALOG) 0.1 % ointment Apply to thin areas of psoriasis twice daily as needed 454 g 6   ??? valACYclovir (VALTREX) 500 MG tablet Take 500 mg by mouth. Frequency:PHARMDIR   Dosage:500   MG  Instructions:  Note:take one tablet twice a day for three days whenever have outbreak of fever blisters Dose: 500MG        No current facility-administered medications for  this visit.         Changes to medications: Teresa Sloan reports no changes at this time.    No Known Allergies    Changes to allergies: No    SPECIALTY MEDICATION ADHERENCE     Cosentyx - 0 left    Medication Adherence    Adherence tools used: calendar          Specialty medication(s) dose(s) confirmed: Regimen is correct and unchanged.     Are there any concerns with adherence? No    Adherence counseling provided? Not needed    CLINICAL MANAGEMENT AND INTERVENTION      Clinical Benefit Assessment:    Do you feel the medicine is effective or helping your condition? Yes    Clinical Benefit counseling provided? Not needed    Adverse Effects Assessment:    Are you experiencing any side effects? No Are you experiencing difficulty administering your medicine? No    Quality of Life Assessment:    How many days over the past month did your PSORIASIS  keep you from your normal activities? For example, brushing your teeth or getting up in the morning. 0    Have you discussed this with your provider? Not needed    Therapy Appropriateness:    Is therapy appropriate? Yes, therapy is appropriate and should be continued    DISEASE/MEDICATION-SPECIFIC INFORMATION      For patients on injectable medications: Patient currently has 0 doses left.  Next injection is scheduled for Nov 14.    PATIENT SPECIFIC NEEDS     ? Does the patient have any physical, cognitive, or cultural barriers? No    ? Is the patient high risk? No     ? Does the patient require a Care Management Plan? No     ? Does the patient require physician intervention or other additional services (i.e. nutrition, smoking cessation, social work)? No      SHIPPING     Specialty Medication(s) to be Shipped:   Inflammatory Disorders: Cosentyx    Other medication(s) to be shipped: na     Changes to insurance: No    Delivery Scheduled: Yes, Expected medication delivery date: 01/30/19.     Medication will be delivered via Same Day Courier to the confirmed prescription address in [facility].    The patient will receive a drug information handout for each medication shipped and additional FDA Medication Guides as required.  Verified that patient has previously received a Conservation officer, historic buildings.    All of the patient's questions and concerns have been addressed.    [name]   [facility] Specialty Pharmacist    -----------------------------------    [facility] Pharmacist Intervention    Type of intervention: non-covered medication/ coordination of care    Medication: Differin Problem: When I spoke with patient and asked about of her derm agents, she reported she was not able to get Differin from her pharmacy, as it was $300.     Intervention: I will reach out to Dr. Dub Mikes to see about alternative/ see if she would like to send the Rx to [facility] to see about coverage options. It appears based on Medicaid's PDL that the BRAND differin is covered, so unclear exactly what the issue is.       Follow up needed: Will plan to have staff test claim order if received.     Approximate time spent: 8 minutes    Zayden Maffei A Katrinka Blazing  [facility] Specialty Pharmacist  --------------------------------------------------------------------------------------------

## 2019-01-29 ENCOUNTER — Encounter
Admit: 2019-01-29 | Discharge: 2019-01-30 | Payer: BLUE CROSS/BLUE SHIELD | Attending: Family Medicine | Primary: Family Medicine

## 2019-01-30 MED FILL — COSENTYX PEN 300 MG/2 PENS (150 MG/ML) SUBCUTANEOUS: 28 days supply | Qty: 2 | Fill #0 | Status: AC

## 2019-01-30 MED FILL — COSENTYX PEN 300 MG/2 PENS (150 MG/ML) SUBCUTANEOUS: 28 days supply | Qty: 2 | Fill #0

## 2019-02-23 NOTE — Unmapped (Signed)
[facility] Refill Coordination Note    Specialty Medication(s) to be Shipped:   Inflammatory Disorders: Cosentyx    Other medication(s) to be shipped:       AVE SCHARNHORST, DOB: 04-Dec-1955  Phone: [phone] (home)       All above HIPAA information was verified with patient.     Was a Nurse, learning disability used for this call? No    Completed refill call assessment today to schedule patient's medication shipment from the [facility] [phone]).       Specialty medication(s) and dose(s) confirmed: Regimen is correct and unchanged.   Changes to medications: Kristalynn reports no changes at this time.  Changes to insurance: No  Questions for the pharmacist: No    Confirmed patient received Welcome Packet with first shipment. The patient will receive a drug information handout for each medication shipped and additional FDA Medication Guides as required.       DISEASE/MEDICATION-SPECIFIC INFORMATION        For patients on injectable medications: Patient currently has 0 doses left.  Next injection is scheduled for 12/14.    SPECIALTY MEDICATION ADHERENCE     Medication Adherence    Patient reported X missed doses in the last month: 0  Specialty Medication: cosentyx 150mg /ml  Patient is on additional specialty medications: No  Informant: patient  Adherence tools used: calendar                Cosentyx 150 mg/ml: 0 days of medicine on hand         SHIPPING     Shipping address confirmed in Epic.     Delivery Scheduled: Yes, Expected medication delivery date: 12/10.     Medication will be delivered via Same Day Courier to the prescription address in Epic WAM.    Antonietta Barcelona   [facility] Specialty Technician

## 2019-03-01 MED FILL — COSENTYX PEN 300 MG/2 PENS (150 MG/ML) SUBCUTANEOUS: 28 days supply | Qty: 2 | Fill #1 | Status: AC

## 2019-03-01 MED FILL — COSENTYX PEN 300 MG/2 PENS (150 MG/ML) SUBCUTANEOUS: 28 days supply | Qty: 2 | Fill #1

## 2019-03-12 NOTE — Unmapped (Deleted)
Doximity video request sent at 1127  Called pt at 1132, pt unable to complete visit, stated she was driving home, requested video visit at 1230, agreed to contact pt at 1230    doximity video request sent at 1230  Called at 1233, no answer, left message  Called again 1239, no answer, left message asking patient to reschedule        [facility] Rhe[facility]ology Telemedicine Visit     I spent *** minutes on the {phone audio video visit:[id number]} with the patient. I spent an additional *** minutes on pre- and post-visit activities.     The patient was physically located in [place] or a state in which I am permitted to provide care. The patient and/or parent/guardian understood that s/he may incur co-pays and cost sharing, and agreed to the telemedicine visit. The visit was reasonable and appropriate under the circumstances given the patient's presentation at the time.    The patient and/or parent/guardian has been advised of the potential risks and limitations of this mode of treatment (including, but not limited to, the absence of in-person examination) and has agreed to be treated using telemedicine. The patient's/patient's family's questions regarding telemedicine have been answered.     If the visit was completed in an ambulatory setting, the patient and/or parent/guardian has also been advised to contact their provider???s office for worsening conditions, and seek emergency medical treatment and/or call 911 if the patient deems either necessary.       I am located off-site and the patient is located off-site for this visit.      REASON FOR VISIT: F/U joint pain    Identification: Pt self identified using name and date of birth  Patient location: ***  The limitations of this telemedicine encounter were discussed with patient. Both the patient and myself agreed to this encounter despite these limitations. Benefits of this telemedicine encounter included allowing for continued care of patient and minimizing risk of exposure to COVID-19.     HISTORY: Ms. Sparlin is a 63 y.o. female with a history of HTN, Plaque Psoriasis (currently followed by Jamelle Haring on Cosentyx since 02/2018), and Obesity who presents for follow up for joint pain and swelling. She was evaluated by [facility] in 2006 with Dr. Luster Landsberg, who at that time felt she had Synovitis of b/l IP joints and suspected she had underlying Psoriatic Arthritis. He performed intraarticular steroid injections and recommended continuing MTX. She was not seen again at [facility] for further follow up with Rheumatology. She re-established care with Dr. Delton See 11/2018 via telemedicine. At this visit her joint pain was possibly from PsA or OA, but since physical exam limited unable to say for sure. She was restarted on MTX as trial. Recent Xrays suggestive of OA, but inflammatory markers mildly elevated.     Last Visit: initial consult 12/07/2018 via telemedicine with Dr. Delton See    Interim history:   Pt presents via phone ***call for follow up.    MTX? 15mg  weekly?    Cosentyx?      Joint pain? Swelling? AM stiffness?    Psoriasis?    Fever?  Illness?  Infections?      CURRENT MEDICATIONS:  Current Outpatient Medications   Medication Sig Dispense Refill   ??? amLODIPine (NORVASC) 10 MG tablet Take 1 tablet (10 mg total) by mouth daily. 30 tablet 11   ??? ammonium lactate (LAC-HYDRIN) 12 % lotion Apply 1 application topically once daily. 400 g 6   ??? ciclopirox (PENLAC) 8 %  solution Apply over nail and surrounding skin. Apply daily over previous coat. After seven (7) days, may remove with alcohol and continue cycle. 6.6 mL 1   ??? clindamycin (CLEOCIN T) 1 % lotion Apply topically Two (2) times a day. To affected areas in armpit, groin, abdomen x 3-4 weeks, less if resolved 120 mL 2   ??? clobetasol (TEMOVATE) 0.05 % ointment Apply to thick areas of psoriasis twice daily as needed. Avoid face and skin folds 120 g 3   ??? empty container (SHARPS-A-GATOR DISPOSAL SYSTEM) Misc use to dispose of needles 1 each 2   ??? folic acid (FOLVITE) 1 MG tablet Take 1 tablet (1 mg total) by mouth daily. 90 tablet 1   ??? hydroCHLOROthiazide (HYDRODIURIL) 25 MG tablet Take 1 tablet (25 mg total) by mouth daily. 30 tablet 11   ??? methocarbamol (ROBAXIN) 750 MG tablet Take 750 mg by mouth Four (4) times a day.     ??? methotrexate 2.5 MG tablet Take 6 tablets (15 mg total) by mouth once a week. (start 4 tablets/week for the first 2 weeks) 72 tablet 1   ??? secukinumab (COSENTYX PEN, 2 PENS,) 150 mg/mL PnIj injection Inject the contents of 2 pens (300 mg) under the skin every 4 weeks. 2 mL 6   ??? secukinumab (COSENTYX) 150 mg/mL PnIj injection inject the contents of 2 pens (300 mg) udner the skin once weekly at weeks 0, 1, 2, 3, and 4. then inject the contents of 2 pens (300 mg) every 4 weeks 14 mL 0   ??? triamcinolone (KENALOG) 0.1 % ointment Apply to thin areas of psoriasis twice daily as needed 454 g 6   ??? valACYclovir (VALTREX) 500 MG tablet Take 500 mg by mouth. Frequency:PHARMDIR   Dosage:500   MG  Instructions:  Note:take one tablet twice a day for three days whenever have outbreak of fever blisters Dose: 500MG        No current facility-administered medications for this visit.        Past Medical History:   Diagnosis Date   ??? Anxiety    ??? Arthritis    ??? Depression    ??? GERD (gastroesophageal reflux disease)    ??? Hypertension    ??? Injury of posterior cruciate ligament    ??? Joint pain    ??? Psoriasis         Record Review: Available records were reviewed, including pertinent office visits, labs, and imaging.      REVIEW OF SYSTEMS: Ten system were reviewed and negative except as noted above.    PHYSICAL EXAM:***  Patient reported vitals:  There were no vitals filed for this visit.   General:   Does not sound to be in distress   Lungs:  No wheezing, coughing, or increased respiratory effort noted   Psych:  Appropriate interaction       ASSESSMENT/PLAN:    HCM:   - PCV13 Status:  - PPSV 23 Status:  - TdaP (Booster once every 10 years). Status:  - Annual Influenza vaccine. Status:   - Bone health: not on prednisone   - Plaquenil eye exam:  - Contraception:    F/U 6 months with Dr. Delton See***    *** minutes was spent with the patient, over half of which was counseling regarding dx and treatment plan.

## 2019-03-13 NOTE — Unmapped (Signed)
Attempted to contact patient for their scheduled video visit. Doximity video request sent at 1127, no answer. Called pt at 1132, pt stated she was unable to complete video visit because she was driving home from another appointment. She apologized and was unaware her previous appointment would run so late. Pt requested to have video visit at 1230, agreed to have video visit at 1230 since there is any opening in my schedule.     Sent pt a doximity video request at 1230, no answer. Called pt at 1233, no answer, left message. Called pt again at 1239, no answer, left message asking pt to call clinic at [phone] to reschedule her appointment.    This will be considered a no show.     Brennan Bailey, FNP

## 2019-03-21 DIAGNOSIS — L409 Psoriasis, unspecified: Principal | ICD-10-CM

## 2019-03-21 NOTE — Unmapped (Signed)
[facility] Refill Coordination Note    Specialty Medication(s) to be Shipped:   Inflammatory Disorders: Cosentyx    Other medication(s) to be shipped: n/a     Teresa Sloan, DOB: [birth year]  Phone: [phone] (home)       All above HIPAA information was verified with patient.     Was a Nurse, learning disability used for this call? No    Completed refill call assessment today to schedule patient's medication shipment from the [facility] [phone]).       Specialty medication(s) and dose(s) confirmed: Regimen is correct and unchanged.   Changes to medications: April reports no changes at this time.  Changes to insurance: No  Questions for the pharmacist: No    Confirmed patient received Welcome Packet with first shipment. The patient will receive a drug information handout for each medication shipped and additional FDA Medication Guides as required.       DISEASE/MEDICATION-SPECIFIC INFORMATION        For patients on injectable medications: Patient currently has 0 doses left.  Next injection is scheduled for 04/05/19.    SPECIALTY MEDICATION ADHERENCE     Medication Adherence    Patient reported X missed doses in the last month: 0  Specialty Medication: Cosentyx 150 mg/ml  Patient is on additional specialty medications: No  Informant: patient  Adherence tools used: calendar                Cosentyx 150 mg/ml: 0 days of medicine on hand         SHIPPING     Shipping address confirmed in Epic.     Delivery Scheduled: Yes, Expected medication delivery date: 04/02/19.     Medication will be delivered via Same Day Courier to the prescription address in Epic [place].    Wyatt Mage M Elisabeth Cara   [facility] Specialty Technician

## 2019-04-02 MED FILL — COSENTYX PEN 300 MG/2 PENS (150 MG/ML) SUBCUTANEOUS: 28 days supply | Qty: 2 | Fill #2 | Status: AC

## 2019-04-02 MED FILL — COSENTYX PEN 300 MG/2 PENS (150 MG/ML) SUBCUTANEOUS: 28 days supply | Qty: 2 | Fill #2

## 2019-04-24 NOTE — Unmapped (Signed)
[facility] Refill Coordination Note    Specialty Medication(s) to be Shipped:   Inflammatory Disorders: Cosentyx    Other medication(s) to be shipped:       Teresa Sloan, DOB: [birth year]  Phone: [phone] (home)       All above HIPAA information was verified with patient.     Was a Nurse, learning disability used for this call? No    Completed refill call assessment today to schedule patient's medication shipment from the [facility] [phone]).       Specialty medication(s) and dose(s) confirmed: Regimen is correct and unchanged.   Changes to medications: Teresa Sloan reports no changes at this time.  Changes to insurance: No  Questions for the pharmacist: No    Confirmed patient received Welcome Packet with first shipment. The patient will receive a drug information handout for each medication shipped and additional FDA Medication Guides as required.       DISEASE/MEDICATION-SPECIFIC INFORMATION        For patients on injectable medications: Patient currently has 0 doses left.  Next injection is scheduled for 02/14.    SPECIALTY MEDICATION ADHERENCE     Medication Adherence    Patient reported X missed doses in the last month: 0  Specialty Medication: cosentyx 150mg /ml  Patient is on additional specialty medications: No  Informant: patient  Adherence tools used: calendar                cosentyx 150 mg/ml: 0 days of medicine on hand         SHIPPING     Shipping address confirmed in Epic.     Delivery Scheduled: Yes, Expected medication delivery date: 02/11.     Medication will be delivered via Same Day Courier to the prescription address in Epic [place].    Antonietta Barcelona   [facility] Specialty Technician

## 2019-05-03 MED FILL — COSENTYX PEN 300 MG/2 PENS (150 MG/ML) SUBCUTANEOUS: 28 days supply | Qty: 2 | Fill #3

## 2019-05-03 MED FILL — COSENTYX PEN 300 MG/2 PENS (150 MG/ML) SUBCUTANEOUS: 28 days supply | Qty: 2 | Fill #3 | Status: AC

## 2019-05-25 NOTE — Unmapped (Signed)
[facility] Refill Coordination Note    Specialty Medication(s) to be Shipped:   Inflammatory Disorders: Cosentyx    Other medication(s) to be shipped:       AZARI JANSSENS, DOB: 08/04/1955  Phone: [phone] (home)       All above HIPAA information was verified with patient.     Was a Nurse, learning disability used for this call? No    Completed refill call assessment today to schedule patient's medication shipment from the [facility] [phone]).       Specialty medication(s) and dose(s) confirmed: Regimen is correct and unchanged.   Changes to medications: Mirayah reports no changes at this time.  Changes to insurance: No  Questions for the pharmacist: No    Confirmed patient received Welcome Packet with first shipment. The patient will receive a drug information handout for each medication shipped and additional FDA Medication Guides as required.       DISEASE/MEDICATION-SPECIFIC INFORMATION        For patients on injectable medications: Patient currently has 0 doses left.  Next injection is scheduled for 06/03/2019.    SPECIALTY MEDICATION ADHERENCE     Medication Adherence    Patient reported X missed doses in the last month: 0  Specialty Medication: Cosentyx 150 mg/ml   Patient is on additional specialty medications: No  Any gaps in refill history greater than 2 weeks in the last 3 months: no  Demonstrates understanding of importance of adherence: yes  Informant: patient  Reliability of informant: reliable  Adherence tools used: calendar  Confirmed plan for next specialty medication refill: delivery by pharmacy  Refills needed for supportive medications: not needed                cosentyx 150 mg/ml: 0 days of medicine on hand         SHIPPING     Shipping address confirmed in Epic.     Delivery Scheduled: Yes, Expected medication delivery date: 05/30/2019.     Medication will be delivered via Same Day Courier to the prescription address in Epic [place].    Genevia Bouldin D Rohith Fauth   [facility] Specialty Technician

## 2019-05-30 MED FILL — COSENTYX PEN 300 MG/2 PENS (150 MG/ML) SUBCUTANEOUS: 28 days supply | Qty: 2 | Fill #4

## 2019-05-30 MED FILL — COSENTYX PEN 300 MG/2 PENS (150 MG/ML) SUBCUTANEOUS: 28 days supply | Qty: 2 | Fill #4 | Status: AC

## 2019-06-21 NOTE — Unmapped (Signed)
[facility] Refill Coordination Note    Specialty Medication(s) to be Shipped:   Inflammatory Disorders: Cosentyx    Other medication(s) to be shipped:       Teresa Sloan, DOB: [birth year]  Phone: [phone] (home)       All above HIPAA information was verified with patient.     Was a Nurse, learning disability used for this call? No    Completed refill call assessment today to schedule patient's medication shipment from the [facility] [phone]).       Specialty medication(s) and dose(s) confirmed: Regimen is correct and unchanged.   Changes to medications: Teresa Sloan reports no changes at this time.  Changes to insurance: No  Questions for the pharmacist: No    Confirmed patient received Welcome Packet with first shipment. The patient will receive a drug information handout for each medication shipped and additional FDA Medication Guides as required.       DISEASE/MEDICATION-SPECIFIC INFORMATION        For patients on injectable medications: Patient currently has 0 doses left.  Next injection is scheduled for 07/04/2019.    SPECIALTY MEDICATION ADHERENCE     Medication Adherence    Patient reported X missed doses in the last month: 0  Specialty Medication: Cosentyx 150 mg/ml  Patient is on additional specialty medications: No  Any gaps in refill history greater than 2 weeks in the last 3 months: no  Demonstrates understanding of importance of adherence: yes  Informant: patient  Reliability of informant: reliable  Adherence tools used: calendar  Confirmed plan for next specialty medication refill: delivery by pharmacy  Refills needed for supportive medications: not needed                cosentyx 150 mg/ml: 0 days of medicine on hand         SHIPPING     Shipping address confirmed in Epic.     Delivery Scheduled: Yes, Expected medication delivery date: 06/29/2019.     Medication will be delivered via Same Day Courier to the prescription address in Epic [place].    Andrya Roppolo D Derral Colucci   [facility] Shared [facility] Specialty Technician

## 2019-06-29 MED FILL — COSENTYX PEN 300 MG/2 PENS (150 MG/ML) SUBCUTANEOUS: 28 days supply | Qty: 2 | Fill #5

## 2019-06-29 MED FILL — COSENTYX PEN 300 MG/2 PENS (150 MG/ML) SUBCUTANEOUS: 28 days supply | Qty: 2 | Fill #5 | Status: AC

## 2019-07-20 DIAGNOSIS — R234 Changes in skin texture: Principal | ICD-10-CM

## 2019-07-20 DIAGNOSIS — L409 Psoriasis, unspecified: Principal | ICD-10-CM

## 2019-07-20 MED ORDER — CLOBETASOL 0.05 % TOPICAL OINTMENT
OPHTHALMIC | 3 refills | 0.00000 days | Status: CP
Start: 2019-07-20 — End: ?
  Filled 2019-07-26: qty 60, 20d supply, fill #0

## 2019-07-20 MED ORDER — ADAPALENE 0.1 % TOPICAL GEL
Freq: Every evening | TOPICAL | 5 refills | 0.00000 days | Status: CP
Start: 2019-07-20 — End: ?

## 2019-07-20 MED ORDER — CLOBETASOL 0.05 % TOPICAL OINTMENT: g | 3 refills | 0 days | Status: AC

## 2019-07-20 MED ORDER — TRIAMCINOLONE ACETONIDE 0.1 % TOPICAL OINTMENT
INTRAMUSCULAR | 6 refills | 0.00000 days | Status: CP
Start: 2019-07-20 — End: ?
  Filled 2019-07-26: qty 80, 20d supply, fill #0

## 2019-07-20 NOTE — Unmapped (Signed)
Teresa Sloan reports things are going well with her Cosentyx. She's only had one recent minor flare on her ankles that cleared with clobetasol.     She does still take mtx occasionally - I advised she call rheumatology clinic to get back in since we'll need to check her labs for this.    I requested refills of her topical steroids and differin gel - she reports this has helped with her underarm areas.    [facility] Shared [facility] Clinical Assessment & Refill Coordination Note    Teresa Sloan, DOB: November 07, 1955  Phone: [phone] (home)     All above HIPAA information was verified with patient.     Was a Nurse, learning disability used for this call? No    Specialty Medication(s):   Inflammatory Disorders: Cosentyx     Current Outpatient Medications   Medication Sig Dispense Refill   ??? amLODIPine (NORVASC) 10 MG tablet Take 1 tablet (10 mg total) by mouth daily. 30 tablet 11   ??? ammonium lactate (LAC-HYDRIN) 12 % lotion Apply 1 application topically once daily. 400 g 6   ??? ciclopirox (PENLAC) 8 % solution Apply over nail and surrounding skin. Apply daily over previous coat. After seven (7) days, may remove with alcohol and continue cycle. 6.6 mL 1   ??? clindamycin (CLEOCIN T) 1 % lotion Apply topically Two (2) times a day. To affected areas in armpit, groin, abdomen x 3-4 weeks, less if resolved 120 mL 2   ??? clobetasol (TEMOVATE) 0.05 % ointment Apply to thick areas of psoriasis twice daily as needed. Avoid face and skin folds 120 g 3   ??? empty container (SHARPS-A-GATOR DISPOSAL SYSTEM) Misc use to dispose of needles 1 each 2   ??? folic acid (FOLVITE) 1 MG tablet Take 1 tablet (1 mg total) by mouth daily. 90 tablet 1   ??? hydroCHLOROthiazide (HYDRODIURIL) 25 MG tablet Take 1 tablet (25 mg total) by mouth daily. 30 tablet 11   ??? methocarbamol (ROBAXIN) 750 MG tablet Take 750 mg by mouth Four (4) times a day.     ??? methotrexate 2.5 MG tablet Take 6 tablets (15 mg total) by mouth once a week. (start 4 tablets/week for the first 2 weeks) 72 tablet 1   ??? secukinumab (COSENTYX PEN, 2 PENS,) 150 mg/mL PnIj injection Inject the contents of 2 pens (300 mg) under the skin every 4 weeks. 2 mL 6   ??? secukinumab (COSENTYX) 150 mg/mL PnIj injection inject the contents of 2 pens (300 mg) udner the skin once weekly at weeks 0, 1, 2, 3, and 4. then inject the contents of 2 pens (300 mg) every 4 weeks 14 mL 0   ??? triamcinolone (KENALOG) 0.1 % ointment Apply to thin areas of psoriasis twice daily as needed 454 g 6   ??? valACYclovir (VALTREX) 500 MG tablet Take 500 mg by mouth. Frequency:PHARMDIR   Dosage:500   MG  Instructions:  Note:take one tablet twice a day for three days whenever have outbreak of fever blisters Dose: 500MG        No current facility-administered medications for this visit.        Changes to medications: [name] reports no changes at this time.    No Known Allergies    Changes to allergies: No    SPECIALTY MEDICATION ADHERENCE     Cosentyx - 0 left    Medication Adherence    Patient reported X missed doses in the last month: 0  Specialty Medication: Cosentyx  Patient is on additional specialty medications: No  Adherence tools used: calendar          Specialty medication(s) dose(s) confirmed: Regimen is correct and unchanged.     Are there any concerns with adherence? No    Adherence counseling provided? Not needed    CLINICAL MANAGEMENT AND INTERVENTION      Clinical Benefit Assessment:    Do you feel the medicine is effective or helping your condition? Yes    Clinical Benefit counseling provided? Not needed    Adverse Effects Assessment:    Are you experiencing any side effects? No    Are you experiencing difficulty administering your medicine? No    Quality of Life Assessment:    How many days over the past month did your psoriasis  keep you from your normal activities? For example, brushing your teeth or getting up in the morning. 0    Have you discussed this with your provider? Not needed    Therapy Appropriateness:    Is therapy appropriate? Yes, therapy is appropriate and should be continued    DISEASE/MEDICATION-SPECIFIC INFORMATION      For patients on injectable medications: Patient currently has 0 doses left.  Next injection is scheduled for 5/14.    PATIENT SPECIFIC NEEDS     - Does the patient have any physical, cognitive, or cultural barriers? No    - Is the patient high risk? No     - Does the patient require a Care Management Plan? No     - Does the patient require physician intervention or other additional services (i.e. nutrition, smoking cessation, social work)? No      SHIPPING     Specialty Medication(s) to be Shipped:   Inflammatory Disorders: Cosentyx    Other medication(s) to be shipped: differin gel, clobetasol, triamcinolone, sharps kit     Changes to insurance: No    Delivery Scheduled: Yes, Expected medication delivery date: Thurs, May 6.     Medication will be delivered via Same Day Courier to the confirmed prescription address in [facility].    The patient will receive a drug information handout for each medication shipped and additional FDA Medication Guides as required.  Verified that patient has previously received a Conservation officer, historic buildings.    All of the patient's questions and concerns have been addressed.    [name]   [facility] Specialty Pharmacist

## 2019-07-20 NOTE — Unmapped (Signed)
Patient last seen in the office on 01/01/19. Please advise, Thanks!

## 2019-07-24 MED ORDER — EMPTY CONTAINER
2 refills | 0 days
Start: 2019-07-24 — End: ?

## 2019-07-25 ENCOUNTER — Emergency Department
Admission: EM | Admit: 2019-07-25 | Discharge: 2019-07-25 | Disposition: A | Discharge status: Home / Self Care | Payer: Medicaid Other

## 2019-07-25 MED ORDER — CEFDINIR 300 MG CAPSULE
0 days
Start: 2019-07-25 — End: ?

## 2019-07-25 MED ORDER — AZITHROMYCIN 500 MG TABLET
0 days
Start: 2019-07-25 — End: ?

## 2019-07-25 MED ORDER — FLUCONAZOLE 150 MG TABLET
0 days
Start: 2019-07-25 — End: ?

## 2019-07-26 MED FILL — EMPTY CONTAINER: 120 days supply | Qty: 1 | Fill #0 | Status: AC

## 2019-07-26 MED FILL — EMPTY CONTAINER: 120 days supply | Qty: 1 | Fill #0

## 2019-07-26 MED FILL — COSENTYX PEN 300 MG/2 PENS (150 MG/ML) SUBCUTANEOUS: 28 days supply | Qty: 2 | Fill #6

## 2019-07-26 MED FILL — COSENTYX PEN 300 MG/2 PENS (150 MG/ML) SUBCUTANEOUS: 28 days supply | Qty: 2 | Fill #6 | Status: AC

## 2019-07-26 MED FILL — TRIAMCINOLONE ACETONIDE 0.1 % TOPICAL OINTMENT: 20 days supply | Qty: 80 | Fill #0 | Status: AC

## 2019-07-26 MED FILL — CLOBETASOL 0.05 % TOPICAL OINTMENT: 20 days supply | Qty: 60 | Fill #0 | Status: AC

## 2019-08-21 DIAGNOSIS — Z1231 Encounter for screening mammogram for malignant neoplasm of breast: Principal | ICD-10-CM

## 2019-08-23 DIAGNOSIS — L409 Psoriasis, unspecified: Principal | ICD-10-CM

## 2019-08-23 MED ORDER — COSENTYX PEN 300 MG/2 PENS (150 MG/ML) SUBCUTANEOUS
SUBCUTANEOUS | 0 refills | 0.00000 days | Status: CP
Start: 2019-08-23 — End: ?
  Filled 2019-08-29: qty 2, 28d supply, fill #0

## 2019-08-23 NOTE — Unmapped (Signed)
[facility] Refill Coordination Note    Specialty Medication(s) to be Shipped:   Inflammatory Disorders: Cosentyx    Other medication(s) to be shipped:       Teresa Sloan, DOB: Aug 16, 1955  Phone: [phone] (home)       All above HIPAA information was verified with patient.     Was a Nurse, learning disability used for this call? No    Completed refill call assessment today to schedule patient's medication shipment from the [facility] [phone]).       Specialty medication(s) and dose(s) confirmed: Regimen is correct and unchanged.   Changes to medications: [name] reports no changes at this time.  Changes to insurance: No  Questions for the pharmacist: No    Confirmed patient received Welcome Packet with first shipment. The patient will receive a drug information handout for each medication shipped and additional FDA Medication Guides as required.       DISEASE/MEDICATION-SPECIFIC INFORMATION        For patients on injectable medications: Patient currently has 0 doses left.  Next injection is scheduled for 09/03/2019.    SPECIALTY MEDICATION ADHERENCE     Medication Adherence    Patient reported X missed doses in the last month: 0  Specialty Medication: Cosentyx 150 mg/ml  Patient is on additional specialty medications: No  Any gaps in refill history greater than 2 weeks in the last 3 months: no  Demonstrates understanding of importance of adherence: yes  Informant: patient  Reliability of informant: reliable  Adherence tools used: calendar  Confirmed plan for next specialty medication refill: delivery by pharmacy  Refills needed for supportive medications: not needed                cosentyx 150 mg/ml: 0 days of medicine on hand         SHIPPING     Shipping address confirmed in Epic.     Delivery Scheduled: Yes, Expected medication delivery date: 08/29/2019.  However, Rx request for refills was sent to the provider as there are none remaining.     Medication will be delivered via Same Day Courier to the prescription address in Epic [facility].    [name] [name]   [facility] Shared [facility] Specialty Technician

## 2019-08-23 NOTE — Unmapped (Signed)
Refilled only one month. Patient needs an appointment with Dr. Janice Norrie for further refills. Last quantipheron 02/2018, needs repeat.

## 2019-08-29 MED FILL — COSENTYX PEN 300 MG/2 PENS (150 MG/ML) SUBCUTANEOUS: 28 days supply | Qty: 2 | Fill #0 | Status: AC

## 2019-09-13 ENCOUNTER — Ambulatory Visit: Admit: 2019-09-13 | Discharge: 2019-09-14 | Payer: BLUE CROSS/BLUE SHIELD

## 2019-09-20 DIAGNOSIS — L409 Psoriasis, unspecified: Principal | ICD-10-CM

## 2019-09-20 MED ORDER — COSENTYX PEN 300 MG/2 PENS (150 MG/ML) SUBCUTANEOUS
0 refills | 0.00000 days
Start: 2019-09-20 — End: ?

## 2019-09-21 NOTE — Unmapped (Signed)
Ok thanks. I did inform her

## 2019-09-21 NOTE — Unmapped (Signed)
Done. Patient has been schd. Patient wanted to know if she would be able to get her medication     Thanks  [facility]

## 2019-10-15 ENCOUNTER — Ambulatory Visit
Admit: 2019-10-15 | Discharge: 2019-10-16 | Payer: BLUE CROSS/BLUE SHIELD | Attending: Internal Medicine | Primary: Internal Medicine

## 2019-10-15 DIAGNOSIS — R234 Changes in skin texture: Principal | ICD-10-CM

## 2019-10-15 DIAGNOSIS — Z79899 Other long term (current) drug therapy: Principal | ICD-10-CM

## 2019-10-15 DIAGNOSIS — L409 Psoriasis, unspecified: Principal | ICD-10-CM

## 2019-10-15 MED ORDER — COSENTYX PEN 300 MG/2 PENS (150 MG/ML) SUBCUTANEOUS
SUBCUTANEOUS | 11 refills | 28.00000 days | Status: CP
Start: 2019-10-15 — End: ?
  Filled 2019-10-18: qty 2, 28d supply, fill #0

## 2019-10-15 MED ORDER — TRETINOIN 0.05 % TOPICAL CREAM
5 refills | 0.00000 days | Status: CP
Start: 2019-10-15 — End: ?

## 2019-10-15 NOTE — Unmapped (Signed)
Dermatology Note     Assessment and Plan:      Psoriasis with PsA--chronic, stable  - Given good control, will continue with Cosentyx.  - Recommended regular follow-up with Rheumatology who are managing her Methotrexate for PsA. She continues on Methotrexate 15 mg weekly.  - Refilled secukinumab (COSENTYX PEN, 2 PENS,) 150 mg/mL PnIj injection; Inject the contents of 2 pens (300 mg) under the skin every 4 weeks.    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  - Start tretinoin (RETIN-A) 0.05 % cream; Apply thin layer to underarms a few nights a week. Increase as tolerated. GoodRx coupon discussed.      The patient was advised to call for an appointment should any new, changing, or symptomatic lesions develop.     RTC: Return in about 1 year (around 10/14/2020) for psoriasis. or sooner as needed   _________________________________________________________________      Chief Complaint     Chief Complaint   Patient presents with   ??? Psoriasis     Medication refills       HPI     Teresa Sloan is a 64 y.o. female who presents as a returning patient (last seen 01/01/2019) to [facility] for follow up of psoriasis.     She reports very good control of psoriasis with Cosentyx. She has only 1 small area of psoriasis on the L dorsal foot today. She has R 2nd finger PsA involvement and recently noticed radiating pain up her B/L ankles. She is planning to follow-up with Rheumatology soon, who are managing her Methotrexate. She is currently on 15 mg of Methotrexate weekly.    Separately she reports that her granular parakeratosis has not improved and has slightly worsened. She is using Differin 0.1% gel without improvement. She does not have irritation from the medicine.    The patient denies any other new or changing lesions or areas of concern.     Pertinent Past Medical History     No history of skin cancer    Problem List        Musculoskeletal and Integument    Psoriasis - Primary    Relevant Medications    secukinumab (COSENTYX PEN, 2 PENS,) 150 mg/mL PnIj injection    tretinoin (RETIN-A) 0.05 % cream          Family History:   Negative for melanoma    Past Medical History, Family History, Social History, Medication List, Allergies, and Problem List were reviewed in the rooming section of Epic.     ROS: Other than symptoms mentioned in the HPI, no fevers, chills, or other skin complaints    Physical Examination     GENERAL: Well-appearing Fitzpatrick skin type V female in no acute distress, resting comfortably.  NEURO: Alert and oriented, answers questions appropriately  PSYCH: Normal mood and affect  SKIN (Focal Skin Exam): Per patient request, examination of BUE, BLE, back was performed  -1 small erythematous scaly papule on the L dorsal foot.    All areas not commented on are within normal limits or unremarkable      (Approved Template 08/10/2019)

## 2019-10-15 NOTE — Unmapped (Signed)
Psoriasis  - Continue secukinumab (COSENTYX PEN, 2 PENS,) 150 mg/mL PnIj injection; Inject the contents of 2 pens (300 mg) under the skin every 4 weeks.  Dispense: 2 mL; Refill: 11    Underarm rash:  -Start tretinoin (RETIN-A) 0.05 % cream; Apply thin layer to underarms a few nights a week. Increase as tolerated.  Dispense: 45 g; Refill: 5

## 2019-10-15 NOTE — Unmapped (Signed)
[facility] Refill Coordination Note    Specialty Medication(s) to be Shipped:   Inflammatory Disorders: Cosentyx    Other medication(s) to be shipped: No additional medications requested for fill at this time     Teresa Sloan, DOB: 05-08-1955  Phone: [phone] (home)       All above HIPAA information was verified with patient.     Was a Nurse, learning disability used for this call? No    Completed refill call assessment today to schedule patient's medication shipment from the [facility] [phone]).       Specialty medication(s) and dose(s) confirmed: Regimen is correct and unchanged.   Changes to medications: Teresa Sloan reports no changes at this time.  Changes to insurance: No  Questions for the pharmacist: No    Confirmed patient received Welcome Packet with first shipment. The patient will receive a drug information handout for each medication shipped and additional FDA Medication Guides as required.       DISEASE/MEDICATION-SPECIFIC INFORMATION        For patients on injectable medications: Patient currently has 0 doses left.  Next injection is scheduled for 7/30.    SPECIALTY MEDICATION ADHERENCE     Medication Adherence    Patient reported X missed doses in the last month: 0  Specialty Medication: cosentyx  Patient is on additional specialty medications: No  Patient is on more than two specialty medications: No  Any gaps in refill history greater than 2 weeks in the last 3 months: no  Demonstrates understanding of importance of adherence: yes  Informant: patient  Adherence tools used: calendar              Cosentyx 150mg /ml: Patient has 0 days of medication on hand      SHIPPING     Shipping address confirmed in Epic.     Delivery Scheduled: Yes, Expected medication delivery date: 7/29.     Medication will be delivered via Same Day Courier to the prescription address in Epic WAM.    Olga Millers   [facility] Specialty Technician

## 2019-10-16 NOTE — Unmapped (Signed)
I saw and evaluated the patient, participating in the key portions of the service.  I reviewed the resident???s note.  I agree with the resident???s findings and plan.   Bronson Curb, MD/MPH

## 2019-10-18 MED FILL — COSENTYX PEN 300 MG/2 PENS (150 MG/ML) SUBCUTANEOUS: 28 days supply | Qty: 2 | Fill #0 | Status: AC

## 2019-10-22 LAB — QUANTIFERON TB GOLD PLUS
QUANTIFERON ANTIGEN 1 MINUS NIL: 0 [IU]/mL
QUANTIFERON ANTIGEN 2 MINUS NIL: 0.04 [IU]/mL
QUANTIFERON MITOGEN: 9.92 [IU]/mL

## 2019-10-22 LAB — TB AG1 VALUE: Lab: 0.08

## 2019-10-22 LAB — TB AG2 VALUE: Lab: 0.12

## 2019-10-22 LAB — TB MITOGEN VALUE: Lab: 10

## 2019-10-22 LAB — TB NIL VALUE: Lab: 0.08

## 2019-10-22 LAB — QUANTIFERON MITOGEN: Lab: 9.92

## 2019-10-23 NOTE — Unmapped (Signed)
Reviewed results of negative Quant gold. Patient notified through letter.    Theora Gianotti, MD  PGY-4 Resident Physician  [facility] Department of Dermatology

## 2019-11-01 ENCOUNTER — Ambulatory Visit: Admit: 2019-11-01 | Discharge: 2019-11-02 | Payer: BLUE CROSS/BLUE SHIELD

## 2019-11-01 DIAGNOSIS — Z1212 Encounter for screening for malignant neoplasm of rectum: Principal | ICD-10-CM

## 2019-11-01 DIAGNOSIS — Z0000 Encounter for general adult medical examination without abnormal findings: Principal | ICD-10-CM

## 2019-11-01 DIAGNOSIS — N644 Mastodynia: Principal | ICD-10-CM

## 2019-11-01 DIAGNOSIS — Z1211 Encounter for screening for malignant neoplasm of colon: Secondary | ICD-10-CM

## 2019-11-01 DIAGNOSIS — I10 Essential (primary) hypertension: Principal | ICD-10-CM

## 2019-11-01 MED ORDER — LISINOPRIL 20 MG TABLET
ORAL_TABLET | Freq: Every day | ORAL | 2 refills | 30 days | Status: CP
Start: 2019-11-01 — End: ?

## 2019-11-01 MED ORDER — MELOXICAM 15 MG TABLET
ORAL_TABLET | Freq: Every day | ORAL | 0 refills | 14 days | Status: CP
Start: 2019-11-01 — End: ?

## 2019-11-01 MED ORDER — AMLODIPINE 10 MG TABLET
ORAL_TABLET | Freq: Every day | ORAL | 11 refills | 30 days | Status: CP
Start: 2019-11-01 — End: 2020-10-31

## 2019-11-01 MED ORDER — HYDROCHLOROTHIAZIDE 25 MG TABLET
ORAL_TABLET | Freq: Every day | ORAL | 11 refills | 30 days | Status: CP
Start: 2019-11-01 — End: ?
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
## 2019-11-01 NOTE — Unmapped (Signed)
We've started a new medication, Lisinopril, which you will take once daily. Please return in 2 weeks to get labs done and get your blood pressure checked.

## 2019-11-02 NOTE — Unmapped (Signed)
I was immediately available on site to see the patient and discuss the case with the resident. I have reviewed and agree with the assessment and plan as documented in Dr. Glena Norfolk note.   William Dalton MD

## 2019-11-08 NOTE — Unmapped (Signed)
[facility] Refill Coordination Note    Specialty Medication(s) to be Shipped:   Inflammatory Disorders: Cosentyx    Other medication(s) to be shipped: No additional medications requested for fill at this time     Teresa Sloan, DOB: 27-Mar-1955  Phone: [phone] (home)       All above HIPAA information was verified with patient.     Was a Nurse, learning disability used for this call? No    Completed refill call assessment today to schedule patient's medication shipment from the [facility] [phone]).       Specialty medication(s) and dose(s) confirmed: Regimen is correct and unchanged.   Changes to medications: [name] reports starting the following medications: HCTZ 25mg  1 td, Amlodipine 10mg  1t nightly, Lisinopril 20 mg 1td, Meloxicam 15 mg 1td  Changes to insurance: No  Questions for the pharmacist: No    Confirmed patient received Welcome Packet with first shipment. The patient will receive a drug information handout for each medication shipped and additional FDA Medication Guides as required.       DISEASE/MEDICATION-SPECIFIC INFORMATION        For patients on injectable medications: Patient currently has 0 doses left.  Next injection is scheduled for 11/16/2019.    SPECIALTY MEDICATION ADHERENCE     Medication Adherence    Patient reported X missed doses in the last month: 0  Specialty Medication: Cosentyx 150 mg/ml  Patient is on additional specialty medications: No  Any gaps in refill history greater than 2 weeks in the last 3 months: no  Demonstrates understanding of importance of adherence: yes  Informant: patient  Reliability of informant: reliable  Adherence tools used: calendar  Confirmed plan for next specialty medication refill: delivery by pharmacy  Refills needed for supportive medications: not needed              Cosentyx 150mg /ml: Patient has 0 days of medication on hand      SHIPPING     Shipping address confirmed in Epic.     Delivery Scheduled: Yes, Expected medication delivery date: 11/13/2019.     Medication will be delivered via Same Day Courier to the prescription address in Epic WAM.    Rajat Staver D Xena Propst   [facility] Specialty Technician

## 2019-11-09 NOTE — Unmapped (Signed)
Opened in error

## 2019-11-12 ENCOUNTER — Ambulatory Visit: Admit: 2019-11-12 | Payer: BLUE CROSS/BLUE SHIELD

## 2019-11-13 MED FILL — COSENTYX PEN 300 MG/2 PENS (150 MG/ML) SUBCUTANEOUS: 28 days supply | Qty: 2 | Fill #1 | Status: AC

## 2019-11-13 MED FILL — COSENTYX PEN 300 MG/2 PENS (150 MG/ML) SUBCUTANEOUS: SUBCUTANEOUS | 28 days supply | Qty: 2 | Fill #1

## 2019-11-20 ENCOUNTER — Ambulatory Visit: Admit: 2019-11-20 | Discharge: 2019-11-21 | Payer: BLUE CROSS/BLUE SHIELD

## 2019-11-20 LAB — CBC W/ AUTO DIFF
BASOPHILS ABSOLUTE COUNT: 0.1 10*9/L (ref 0.0–0.1)
BASOPHILS RELATIVE PERCENT: 0.4 %
EOSINOPHILS ABSOLUTE COUNT: 0 10*9/L (ref 0.0–0.4)
EOSINOPHILS RELATIVE PERCENT: 0.2 %
HEMATOCRIT: 49.5 % — ABNORMAL HIGH (ref 36.0–46.0)
HEMOGLOBIN: 16.8 g/dL — ABNORMAL HIGH (ref 12.0–16.0)
LYMPHOCYTES ABSOLUTE COUNT: 0.7 10*9/L — ABNORMAL LOW (ref 1.5–5.0)
LYMPHOCYTES RELATIVE PERCENT: 6.7 %
MEAN CORPUSCULAR HEMOGLOBIN CONC: 34 g/dL (ref 31.0–37.0)
MEAN CORPUSCULAR HEMOGLOBIN: 30 pg (ref 26.0–34.0)
MEAN CORPUSCULAR VOLUME: 88.5 fL (ref 80.0–100.0)
MEAN PLATELET VOLUME: 9.2 fL (ref 7.0–10.0)
MONOCYTES ABSOLUTE COUNT: 0.3 10*9/L (ref 0.2–0.8)
NEUTROPHILS RELATIVE PERCENT: 88.5 %
PLATELET COUNT: 340 10*9/L (ref 150–440)
RED BLOOD CELL COUNT: 5.6 10*12/L — ABNORMAL HIGH (ref 4.00–5.20)
RED CELL DISTRIBUTION WIDTH: 13.7 % (ref 12.0–15.0)
WBC ADJUSTED: 10.4 10*9/L (ref 4.5–11.0)

## 2019-11-20 LAB — COMPREHENSIVE METABOLIC PANEL
ALBUMIN: 3.5 g/dL (ref 3.4–5.0)
ALKALINE PHOSPHATASE: 105 U/L (ref 46–116)
ALT (SGPT): 32 U/L (ref 10–49)
ANION GAP: 12 mmol/L (ref 5–14)
AST (SGOT): 37 U/L — ABNORMAL HIGH (ref <=34)
BILIRUBIN TOTAL: 1 mg/dL (ref 0.3–1.2)
BLOOD UREA NITROGEN: 16 mg/dL (ref 9–23)
CALCIUM: 9.3 mg/dL (ref 8.7–10.4)
CHLORIDE: 96 mmol/L — ABNORMAL LOW (ref 98–107)
CO2: 23 mmol/L (ref 20.0–31.0)
CREATININE: 1.07 mg/dL — ABNORMAL HIGH
EGFR CKD-EPI AA FEMALE: 64 mL/min/{1.73_m2} (ref >=60)
EGFR CKD-EPI NON-AA FEMALE: 55 mL/min/{1.73_m2} — ABNORMAL LOW (ref >=60)
GLUCOSE RANDOM: 207 mg/dL — ABNORMAL HIGH (ref 70–179)
POTASSIUM: 3.1 mmol/L — ABNORMAL LOW (ref 3.4–4.5)
PROTEIN TOTAL: 8.1 g/dL (ref 5.7–8.2)
SODIUM: 131 mmol/L — ABNORMAL LOW (ref 135–145)

## 2019-11-20 LAB — D-DIMER QUANTITATIVE (CW,ML,HL): Lab: 1556 — ABNORMAL HIGH

## 2019-11-20 LAB — D-DIMER, QUANTITATIVE: D-DIMER QUANTITATIVE (CW,ML,HL): 1556 ng{FEU}/mL — ABNORMAL HIGH (ref <=500)

## 2019-11-20 LAB — B-TYPE NATRIURETIC PEPTIDE: Natriuretic peptide.B:MCnc:Pt:Bld:Qn:: 15

## 2019-11-20 LAB — HIGH SENSITIVITY TROPONIN I: Lab: 3

## 2019-11-20 LAB — BILIRUBIN TOTAL: Bilirubin:MCnc:Pt:Ser/Plas:Qn:: 1

## 2019-11-20 LAB — BASOPHILS ABSOLUTE COUNT: Basophils:NCnc:Pt:Bld:Qn:Automated count: 0.1

## 2019-11-20 LAB — HIGH SENSITIVITY TROPONIN I - SINGLE: HIGH SENSITIVITY TROPONIN I: 3 ng/L (ref <=34)

## 2019-11-20 MED ADMIN — ondansetron (ZOFRAN) injection 4 mg: 4 mg | INTRAVENOUS | @ 20:00:00 | Stop: 2019-11-20

## 2019-11-20 MED ADMIN — ketorolac (TORADOL) injection 15 mg: 15 mg | INTRAVENOUS | @ 18:00:00 | Stop: 2019-11-20

## 2019-11-20 MED ADMIN — lactated ringers bolus 1,000 mL: 1000 mL | INTRAVENOUS | @ 14:00:00 | Stop: 2019-11-20

## 2019-11-20 MED ADMIN — iohexoL (OMNIPAQUE) 350 mg iodine/mL solution 75 mL: 75 mL | INTRAVENOUS | @ 16:00:00 | Stop: 2019-11-20

## 2019-11-20 MED ADMIN — lactated ringers bolus 500 mL: 500 mL | INTRAVENOUS | @ 18:00:00 | Stop: 2019-11-20

## 2019-11-20 MED ADMIN — potassium chloride 10 mEq in 100 mL IVPB: 10 meq | INTRAVENOUS | @ 21:00:00 | Stop: 2019-11-20

## 2019-11-20 MED ADMIN — ondansetron (ZOFRAN) injection 4 mg: 4 mg | INTRAVENOUS | @ 14:00:00 | Stop: 2019-11-20

## 2019-11-20 MED ADMIN — magnesium sulfate in water 2 gram/50 mL (4 %) IVPB 2 g: 2 g | INTRAVENOUS | @ 21:00:00 | Stop: 2019-11-20

## 2019-11-20 NOTE — Unmapped (Signed)
ED Progress Note    I assumed care of this patient from Dr. Leo Grosser at sign out (3:00 PM).     Briefly, Teresa Sloan is a 64 y.o. female GERD, HTN, anxiety, and depression presenting to the ED for 3 weeks of persistent fatigue, dyspnea on exertion, cough, headache, and myalgias in the setting of testing positive for Covid 3 weeks ago.     Basic labs, CXR, and CTA Chest ordered. CXR shows mild pulmonary edema and CTA shows multifocal PNA. D-dimer elevated to 1556. Covid positive.  Labs with mild hypokalemia and hyponatremia.  Potassium repletion initiated.  Patient also hyperglycemic to 207.  Labs with evidence of volume contraction.    With ambulation, patient hypoxic to 90%, tachypneic to 48.  She has also had multiple episodes of vomiting.  Given her deconditioned status and hypoxia with ambulation, will discuss with hospitalist for admission.    Attestations     Documentation assistance was provided by Lenward Chancellor, Scribe, on November 20, 2019 at 3:27 PM for Lisa Roca, MD.      Documentation assistance was provided by the scribe in my presence.  The documentation recorded by the scribe has been reviewed by me and accurately reflects the services I personally performed.      Resa Miner. Ernst Bowler, MD, M. Renae Fickle  Chief Resident, [facility]  Pager: [phone]

## 2019-11-20 NOTE — Unmapped (Addendum)
[facility]  Emergency Department Provider Note    Initial Impression, ED Course, Assessment and Plan     Impression: 64 y.o. female with history of obesity, anxiety depression GERD psoriasis presents the emergency department for evaluation of 3 weeks of fatigue emesis dyspnea particularly on exertion in the setting of testing positive for Covid weeks ago.  Patient is unvaccinated.    Patient appears uncomfortable but no acute distress heart rate notable for value of 127 but vitals are otherwise within normal limits.  Normal cardiopulmonary exam does not appear to be significantly fluid overloaded.  Lungs are clear to auscultation.  Patient has dry mucous membranes.    BP 133/90  - Pulse 101  - Temp 36.1 ??C (97 ??F) (Oral)  - Resp 12  - SpO2 96%     Assessment/Plan: Patient symptoms are likely due to a prolonged COVID-19 infection in the setting of multiple comorbidities particularly obesity.  Additionally considering pulmonary embolism given significant tachycardia.  D-dimer ordered in addition to EKG CBC CMP chest x-ray    Will begin treatment with LR    EKG: Sinus tachycardia with normal PR normal QRS normal QT intervals no axis deviation no ST segment elevation or depression no T wave inversion normal EKG other than sinus tachycardia.    ED Course:   ED Course as of Nov 19 1441   Tue Nov 20, 2019   1107 BNP: 15   1108 hsTroponin I: 3   1108 D-Dimer(!): 1,556   1223 IMPRESSION:  ??  No pulmonary embolism.  ??  Multifocal pneumonia.   CTA Chest W Contrast         MDM/Disposition: CT negative for evidence of pulmonary embolism.  EKG and troponin negative for evidence of cardiac ischemia.  Tachycardia vastly improved after administration of fluids.  Patient tolerated ambulatory sat without significant desaturation past 90.  After fluid bolus patient still reporting malaise and saying that she does not feel well/comfortable going home.  Repeat ambulatory sat dropped her to 91% on pulse oximetry however she was significantly tachypneic at 48 respirations per nursing staff.    Plan to admit for Covid pneumonia.      _____________________________________________________    The case was discussed with the attending physician who is in agreement with the above assessment and plan.    ED Clinical Impression     Final diagnoses:   COVID-19 (Primary)       Additional Medical Decision Making     Time seen: November 20, 2019 11:00 AM    I have reviewed the vital signs and the nursing notes. Labs and radiology results that were available during my care of the patient were independently reviewed by me and considered in my medical decision making.     I independently visualized the EKG tracing if performed  I independently visualized the radiology images if performed  I reviewed the patient's prior medical records if available.  Additional history obtained from family if available    Please note- This chart has been created using [employer]. Chart creation errors have been sought, but may not always be located and such creation errors, especially pronoun confusion, do NOT reflect on the standard of medical care.      History     Chief Complaint  Nausea      History provided by patient.     HPI   Teresa Sloan is a 64 y.o. female with history of obesity, anxiety depression GERD psoriasis presents the  emergency department for evaluation of 3 weeks of fatigue emesis dyspnea particularly on exertion in the setting of testing positive for Covid weeks ago.  She states that she was initially evaluated and sent home to quarantine.  She states that her symptoms have remained constant and not improved.  Endorsing dyspnea at rest that is worsening with exertion but not with lying flat.  Denies chest pain.  States that she has a mild cough is nonproductive.  Denies fevers endorses headache and body aches.  States that she has been having emesis for the last several days and more described as gagging than true emesis this is nonbloody nonbilious she is intermittently able to tolerate liquid p.o. but states that she has not had a good meal in several weeks.    Patient is unvaccinated.      ROS - 10 point review of systems was performed and is negative unless otherwise noted in HPI     CONST: Does not endorse fever/chills  NEURO: Does not endorse headache  NECK: Does not endorse neck stiffness  ENT: Does not endorse congestion or sore throat  CV: Does not endorse chest pain, syncope  RESP: Does not endorse cough or shortness of breath.  GI: Does not endorse nausea, vomiting, or abdominal pain  HEME: Does not endorse rectal bleeding  GU: Does not endorse dysuria  SKIN: Does not endorse skin lesions      PAST MEDICAL HISTORY/PAST SURGICAL HISTORY:   Past Medical History:   Diagnosis Date   ??? Anxiety    ??? Arthritis    ??? Depression    ??? GERD (gastroesophageal reflux disease)    ??? Hypertension    ??? Injury of posterior cruciate ligament    ??? Joint pain    ??? Psoriasis        Patient Active Problem List   Diagnosis   ??? Pain in joint, lower leg   ??? Psoriasis   ??? Trigger finger   ??? HTN (hypertension)   ??? Class 2 obesity due to excess calories without serious comorbidity with body mass index (BMI) of 36.0 to 36.9 in adult   ??? Arthralgia of both hands   ??? Arthralgia of both ankles       Past Surgical History:   Procedure Laterality Date   ??? HIP SURGERY     ??? KNEE SURGERY         MEDICATIONS:     Current Facility-Administered Medications:   ???  iohexoL (OMNIPAQUE) 350 mg iodine/mL solution 75 mL, 75 mL, Intravenous, Once in imaging, Sabino Gasser, MD    Current Outpatient Medications:   ???  amLODIPine (NORVASC) 10 MG tablet, Take 1 tablet (10 mg total) by mouth daily., Disp: 30 tablet, Rfl: 11  ???  ammonium lactate (LAC-HYDRIN) 12 % lotion, Apply 1 application topically once daily., Disp: 400 g, Rfl: 6  ???  azithromycin (ZITHROMAX) 500 MG tablet, , Disp: , Rfl:   ???  cefdinir (OMNICEF) 300 MG capsule, , Disp: , Rfl:   ???  ciclopirox (PENLAC) 8 % solution, Apply over nail and surrounding skin. Apply daily over previous coat. After seven (7) days, may remove with alcohol and continue cycle., Disp: 6.6 mL, Rfl: 1  ???  clindamycin (CLEOCIN T) 1 % lotion, Apply topically Two (2) times a day. To affected areas in armpit, groin, abdomen x 3-4 weeks, less if resolved, Disp: 120 mL, Rfl: 2  ???  clobetasoL (TEMOVATE) 0.05 % ointment, Apply to thick areas of psoriasis  twice daily as needed. Avoid face and skin folds, Disp: 60 g, Rfl: 3  ???  empty container (SHARPS-A-GATOR DISPOSAL SYSTEM) Misc, use to dispose of needles, Disp: 1 each, Rfl: 2  ???  empty container Misc, Use as directed to dispose of Cosentyx pens., Disp: 1 each, Rfl: 2  ???  fluconazole (DIFLUCAN) 150 MG tablet, , Disp: , Rfl:   ???  folic acid (FOLVITE) 1 MG tablet, Take 1 tablet (1 mg total) by mouth daily., Disp: 90 tablet, Rfl: 1  ???  hydroCHLOROthiazide (HYDRODIURIL) 25 MG tablet, Take 1 tablet (25 mg total) by mouth daily., Disp: 30 tablet, Rfl: 11  ???  lisinopriL (PRINIVIL,ZESTRIL) 20 MG tablet, Take 1 tablet (20 mg total) by mouth daily. For high blood pressure, Disp: 30 tablet, Rfl: 2  ???  meloxicam (MOBIC) 15 MG tablet, Take 1 tablet (15 mg total) by mouth daily., Disp: 14 tablet, Rfl: 0  ???  methocarbamol (ROBAXIN) 750 MG tablet, Take 750 mg by mouth Four (4) times a day., Disp: , Rfl:   ???  methotrexate 2.5 MG tablet, Take 6 tablets (15 mg total) by mouth once a week. (start 4 tablets/week for the first 2 weeks), Disp: 72 tablet, Rfl: 1  ???  secukinumab (COSENTYX PEN, 2 PENS,) 150 mg/mL PnIj injection, Inject the contents of 2 pens (300 mg) under the skin every 4 weeks., Disp: 2 mL, Rfl: 11  ???  secukinumab (COSENTYX) 150 mg/mL PnIj injection, inject the contents of 2 pens (300 mg) udner the skin once weekly at weeks 0, 1, 2, 3, and 4. then inject the contents of 2 pens (300 mg) every 4 weeks, Disp: 14 mL, Rfl: 0  ???  tretinoin (RETIN-A) 0.05 % cream, Apply thin layer to underarms a few nights a week. Increase as tolerated., Disp: 45 g, Rfl: 5  ???  triamcinolone (KENALOG) 0.1 % ointment, Apply to thin areas of psoriasis twice daily as needed, Disp: 80 g, Rfl: 6  ???  valACYclovir (VALTREX) 500 MG tablet, Take 500 mg by mouth. Frequency:PHARMDIR   Dosage:500   MG  Instructions:  Note:take one tablet twice a day for three days whenever have outbreak of fever blisters Dose: 500MG , Disp: , Rfl:     ALLERGIES:   Patient has no known allergies.    SOCIAL HISTORY:   Social History     Tobacco Use   ??? Smoking status: Never Smoker   ??? Smokeless tobacco: Never Used   Substance Use Topics   ??? Alcohol use: No     Alcohol/week: 0.0 standard drinks       FAMILY HISTORY:  Family History   Problem Relation Age of Onset   ??? Cancer Father    ??? Breast cancer Mother    ??? Cancer Sister    ??? Breast cancer Sister    ??? Breast cancer Maternal Aunt    ??? Melanoma Neg Hx    ??? Basal cell carcinoma Neg Hx    ??? Squamous cell carcinoma Neg Hx           Physical Exam     ED Triage Vitals [11/20/19 0941]   Enc Vitals Group      BP 133/86      Heart Rate 127      SpO2 Pulse       Resp 18      Temp 36.1 ??C (97 ??F)      Temp Source Oral      SpO2 100 %  Weight       Height       Head Circumference       Peak Flow       Pain Score       Pain Loc       Pain Edu?       Excl. in GC?        EXAM     CONST: Appears stated age, answers questions appropriately.  HEENT: Normocephalic and atraumatic. No congestion, epistaxis. Moist mucous membranes.   CV: RRR, normal S1/S2, no murmurs. Intact peripheral pulse. No appreciated peripheral edema.  RESP: Normal respiratory effort.  Lungs diffusely diminished likely secondary to body habitus no focal crackles or wheezing.  ABD: Obese abdomen soft, non tender, non distended, without rebound or guarding. no masses noted.  MSK: Normal range of motion in all extremities. Able to ambulate.   SKIN: Warm, dry and intact without rashes.  HEME: No petechiae or bruising  PSYCH: Mood and affect are normal. Speech and behavior are normal.  NEURO: Normal speech and language. No appreciated facial droop, dysarthria, or aphasia. Moves all extremities spontaneously.      Radiology     CTA Chest W Contrast   Final Result   Addendum 1 of 1   **Please note that the patient's first peripheral IV had infiltrated after    the administration of approximately 20 ml of saline. Examination    demonstrated a 3 x 3 cm subcutaneous collection along the right    antecubital fossa. Patient denied any pain, paresthesias. Neurovascular    exam intact. The study was able to be finished to completion without    difficulty after placement of a second IV.      Potential, albeit low, for compartment syndrome, necrosis, and/or    myositis/tenosynovitis due to the contrast were explained to the patient.    The patient was informed to return to the ED if any symptoms progressed.    The patient was discharged home after being stable and asymptomatic for 30    minutes post event.       The findings of this study were discussed via telephone with DR. Torra Pala S    Janelli Welling by Dr. Arnaldo Natal on 11/20/2019 12:22 PM.      Final      No pulmonary embolism.      Multifocal pneumonia.      XR Chest Portable   Final Result      Mild pulmonary edema.        ECG 12 Lead    Result Date: 11/20/2019  SINUS TACHYCARDIA MINIMAL VOLTAGE CRITERIA FOR LVH, MAY BE NORMAL VARIANT NONSPECIFIC ST ABNORMALITY NO PREVIOUS ECGS AVAILABLE Confirmed by Schuyler Amor [phone]) on 11/20/2019 11:08:16 AM    XR Chest Portable    Result Date: 11/20/2019  EXAM: XR CHEST PORTABLE DATE: 11/20/2019 ACCESSION: [id number] UN DICTATED: 11/20/2019 10:36 AM INTERPRETATION LOCATION: [facility] CLINICAL INDICATION: 64 years old Female with DYSPNEA  COMPARISON: 05/29/2018 TECHNIQUE: PA and Lateral Chest Radiographs. FINDINGS: Hyponflated lungs. Bilateral linear opacities and Kerley B lines. No pleural effusion or pneumothorax. Unremarkable cardiomediastinal silhouette.     Mild pulmonary edema.    CTA Chest W Contrast    Addendum Date: 11/20/2019 **Please note that the patient's first peripheral IV had infiltrated after the administration of approximately 20 ml of saline. Examination demonstrated a 3 x 3 cm subcutaneous collection along the right antecubital fossa. Patient denied any pain, paresthesias. Neurovascular exam intact. The study was able  to be finished to completion without difficulty after placement of a second IV. Potential, albeit low, for compartment syndrome, necrosis, and/or myositis/tenosynovitis due to the contrast were explained to the patient. The patient was informed to return to the ED if any symptoms progressed. The patient was discharged home after being stable and asymptomatic for 30 minutes post event. The findings of this study were discussed via telephone with DR. Brooksie Ellwanger S Drayk Humbarger by Dr. Arnaldo Natal on 11/20/2019 12:22 PM.    Result Date: 11/20/2019  EXAM: CTA CHEST W CONTRAST DATE: 11/20/2019 12:05 PM ACCESSION: [id number] UN DICTATED: 11/20/2019 12:14 PM INTERPRETATION LOCATION: [facility] CLINICAL INDICATION: 64 years old Female with evaluate for PE ; PE suspected, low/intermediate prob, positive D - dimer  COMPARISON: None TECHNIQUE: A helical CTA scan was obtained with IV contrast from the lung apices to the lung bases. Images were reconstructed in the axial plane. Multiplanar reformatted and MIP images were provided. FINDINGS: PULMONARY ARTERIES: No pulmonary embolism. AIRWAYS, LUNGS, PLEURA: Clear central airways. No pleural effusion. Peripheral predominant multifocal ground-glass opacities involving all lung lobes. MEDIASTINUM: Normal heart size. No pericardial effusion.  Normal caliber thoracic aorta.  No mediastinal lymphadenopathy. Small hiatal hernia. IMAGED ABDOMEN: Unremarkable. SOFT TISSUES: Unremarkable. BONES: Unremarkable.     No pulmonary embolism. Multifocal pneumonia.       LABORATORY DATA:     Results for orders placed or performed during the hospital encounter of 11/20/19   COVID-19 PCR    Specimen: Nasopharyngeal Swab   Result Value Ref Range    SARS-CoV-2 PCR Positive (A) Negative    SARS-CoV-2 CT Value 31.6    hsTroponin I (single, no delta)   Result Value Ref Range    hsTroponin I 3 <=34 ng/L   Comprehensive Metabolic Panel   Result Value Ref Range    Sodium 131 (L) 135 - 145 mmol/L    Potassium 3.1 (L) 3.4 - 4.5 mmol/L    Chloride 96 (L) 98 - 107 mmol/L    Anion Gap 12 5 - 14 mmol/L    CO2 23.0 20.0 - 31.0 mmol/L    BUN 16 9 - 23 mg/dL    Creatinine 9.56 (H) 0.60 - 0.80 mg/dL    BUN/Creatinine Ratio 15     EGFR CKD-EPI Non-African American, Female 55 (L) >=60 mL/min/1.90m2    EGFR CKD-EPI African American, Female 29 >=60 mL/min/1.4m2    Glucose 207 (H) 70 - 179 mg/dL    Calcium 9.3 8.7 - 21.3 mg/dL    Albumin 3.5 3.4 - 5.0 g/dL    Total Protein 8.1 5.7 - 8.2 g/dL    Total Bilirubin 1.0 0.3 - 1.2 mg/dL    AST 37 (H) <=08 U/L    ALT 32 10 - 49 U/L    Alkaline Phosphatase 105 46 - 116 U/L   B-type Natriuretic Peptide   Result Value Ref Range    BNP 15 <=100 pg/mL   D-Dimer, Quantitative   Result Value Ref Range    D-Dimer 1,556 (H) <=500 ng/mL FEU   ECG 12 Lead   Result Value Ref Range    EKG Systolic BP  mmHg    EKG Diastolic BP  mmHg    EKG Ventricular Rate 122 BPM    EKG Atrial Rate 122 BPM    EKG P-R Interval 116 ms    EKG QRS Duration 82 ms    EKG Q-T Interval 296 ms    EKG QTC Calculation 421 ms    EKG Calculated P  Axis 50 degrees    EKG Calculated R Axis 25 degrees    EKG Calculated T Axis 36 degrees    QTC Fredericia 374 ms   CBC w/ Differential   Result Value Ref Range    WBC 10.4 4.5 - 11.0 10*9/L    RBC 5.60 (H) 4.00 - 5.20 10*12/L    HGB 16.8 (H) 12.0 - 16.0 g/dL    HCT 21.3 (H) 08.6 - 46.0 %    MCV 88.5 80.0 - 100.0 fL    MCH 30.0 26.0 - 34.0 pg    MCHC 34.0 31.0 - 37.0 g/dL    RDW 57.8 46.9 - 62.9 %    MPV 9.2 7.0 - 10.0 fL    Platelet 340 150 - 440 10*9/L    Neutrophils % 88.5 %    Lymphocytes % 6.7 %    Monocytes % 2.8 %    Eosinophils % 0.2 %    Basophils % 0.4 %    Neutrophil Left Shift 1+ (A) Not Present    Absolute Neutrophils 9.2 (H) 2.0 - 7.5 10*9/L    Absolute Lymphocytes 0.7 (L) 1.5 - 5.0 10*9/L    Absolute Monocytes 0.3 0.2 - 0.8 10*9/L    Absolute Eosinophils 0.0 0.0 - 0.4 10*9/L    Absolute Basophils 0.1 0.0 - 0.1 10*9/L    Large Unstained Cells 1 0 - 4 %          Judie Petit, MD  Resident  11/20/19 1443       Judie Petit, MD  Resident  11/20/19 1500

## 2019-11-20 NOTE — Unmapped (Signed)
States 3 weeks post COVID diagnosis, feeling very ill, anorexia, emesis, some SOB.

## 2019-11-21 ENCOUNTER — Inpatient Hospital Stay: Admit: 2019-11-21 | Payer: BLUE CROSS/BLUE SHIELD

## 2019-11-21 LAB — COMPREHENSIVE METABOLIC PANEL
ALBUMIN: 2.6 g/dL — ABNORMAL LOW (ref 3.4–5.0)
ALKALINE PHOSPHATASE: 80 U/L (ref 46–116)
ALT (SGPT): 21 U/L (ref 10–49)
AST (SGOT): 27 U/L (ref <=34)
BILIRUBIN TOTAL: 0.8 mg/dL (ref 0.3–1.2)
BLOOD UREA NITROGEN: 12 mg/dL (ref 9–23)
BUN / CREAT RATIO: 14
CALCIUM: 8.5 mg/dL — ABNORMAL LOW (ref 8.7–10.4)
CHLORIDE: 98 mmol/L (ref 98–107)
CO2: 28 mmol/L (ref 20.0–31.0)
CREATININE: 0.84 mg/dL — ABNORMAL HIGH
EGFR CKD-EPI AA FEMALE: 86 mL/min/{1.73_m2} (ref >=60)
EGFR CKD-EPI NON-AA FEMALE: 74 mL/min/{1.73_m2} (ref >=60)
POTASSIUM: 3.5 mmol/L (ref 3.4–4.5)
PROTEIN TOTAL: 6.3 g/dL (ref 5.7–8.2)
SODIUM: 133 mmol/L — ABNORMAL LOW (ref 135–145)

## 2019-11-21 LAB — ANION GAP: Anion gap 3:SCnc:Pt:Ser/Plas:Qn:: 7

## 2019-11-21 LAB — HIGH SENSITIVITY TROPONIN I: Lab: 4

## 2019-11-21 MED ADMIN — lisinopriL (PRINIVIL,ZESTRIL) tablet 20 mg: 20 mg | ORAL | @ 01:00:00

## 2019-11-21 MED ADMIN — melatonin tablet 3 mg: 3 mg | ORAL | @ 01:00:00

## 2019-11-21 MED ADMIN — enoxaparin (LOVENOX) syringe 40 mg: 40 mg | SUBCUTANEOUS | @ 01:00:00

## 2019-11-21 MED ADMIN — clobetasoL (TEMOVATE) 0.05 % ointment: TOPICAL | @ 14:00:00 | Stop: 2019-11-21

## 2019-11-21 MED ADMIN — lactated Ringers infusion: 100 mL/h | INTRAVENOUS | @ 01:00:00 | Stop: 2019-11-21

## 2019-11-21 MED ADMIN — ondansetron (ZOFRAN-ODT) disintegrating tablet 4 mg: 4 mg | ORAL | @ 13:00:00 | Stop: 2019-11-21

## 2019-11-21 MED ADMIN — amLODIPine (NORVASC) tablet 10 mg: 10 mg | ORAL | @ 12:00:00 | Stop: 2019-11-21

## 2019-11-21 MED ADMIN — hydroCHLOROthiazide (HYDRODIURIL) tablet 25 mg: 25.0 mg | ORAL | @ 01:00:00

## 2019-11-21 NOTE — Unmapped (Signed)
Care Management  Initial Transition Planning Assessment    CM initial assessment completed telephonically as a precautionary measure in accordance with [facility] COVID-19 Pandemic emergency response plan. Patient verbalized understanding and agreement with completing this assessment by telephone.     Lives alone in single level home, 2 steps to enter. Baseline independent with all ADLs, ambulates without assistance. No HH/DME used. Retired. Willing to accept [facility], daughter will transport home. Reports no financial concerns.              General  Care Manager assessed the patient by : Telephone conversation with patient  Orientation Level: Oriented X4  Functional level prior to admission: Independent  Reason for referral: Discharge Planning    Contact/Decision Maker  Extended Emergency Contact Information  Primary Emergency Contact: [facility]  Home Phone: [phone]  Relation: None  Secondary Emergency Contact: Meneely,Carrie   [place]  Home Phone: [phone]  Relation: None    Legal Next of Kin / Guardian / POA / Advance Directives       Advance Directive (Medical Treatment)  Does patient have an advance directive covering medical treatment?: Patient does not have advance directive covering medical treatment.  Reason patient does not have an advance directive covering medical treatment:: Patient does not wish to complete one at this time.    Health Care Decision Maker [HCDM] (Medical & Mental Health Treatment)  Healthcare Decision Maker: Patient does not wish to appoint a Health Care Decision Maker at this time  Information offered on HCDM, Medical & Mental Health advance directives:: Patient declined information.         Patient Information  Lives with: Alone    Type of Residence: Private residence        Location/Detail: s    Support Systems/Concerns: Family Members    Responsibilities/Dependents at home?: No    Home Care services in place prior to admission?: No                  Equipment Currently Used at Home: none       Currently receiving outpatient dialysis?: No       Financial Information       Need for financial assistance?: No       Social Determinants of Health  Social Determinants of Health     Tobacco Use: Low Risk    ??? Smoking Tobacco Use: Never Smoker   ??? Smokeless Tobacco Use: Never Used   Alcohol Use:    ??? How often do you have a drink containing alcohol?:    ??? How many drinks containing alcohol do you have on a typical day when you are drinking?:    ??? How often do you have 5 or more drinks on one occasion?:    Financial Resource Strain: Low Risk    ??? Difficulty of Paying Living Expenses: Not very hard   Food Insecurity: No Food Insecurity   ??? Worried About Running Out of Food in the Last Year: Never true   ??? Ran Out of Food in the Last Year: Never true   Transportation Needs: No Transportation Needs   ??? Lack of Transportation (Medical): No   ??? Lack of Transportation (Non-Medical): No   Physical Activity:    ??? Days of Exercise per Week:    ??? Minutes of Exercise per Session:    Stress:    ??? Feeling of Stress :    Social Connections:    ??? Frequency of Communication with Friends  and Family:    ??? Frequency of Social Gatherings with Friends and Family:    ??? Attends Religious Services:    ??? Database administrator or Organizations:    ??? Attends Engineer, structural:    ??? Marital Status:    Catering manager Violence:    ??? Fear of Current or Ex-Partner:    ??? Emotionally Abused:    ??? Physically Abused:    ??? Sexually Abused:    Depression: Not at risk   ??? PHQ-2 Score: 0   Housing/Utilities: Low Risk    ??? Within the past 12 months, have you ever stayed: outside, in a car, in a tent, in an overnight shelter, or temporarily in someone else's home (i.e. couch-surfing)?: No   ??? Are you worried about losing your housing?: No   ??? Within the past 12 months, have you been unable to get utilities (heat, electricity) when it was really needed?: No   Substance Use:    ??? Taken prescription drugs for non-medical reasons:    ??? Taken illegal drugs:    ??? Patient indicated they have taken drugs in the past year for non-medical reasons: Yes, [positive answer(s)]:    Health Literacy:    ??? :        Discharge Needs Assessment  Concerns to be Addressed: other (see comments) (tbd)    Clinical Risk Factors: New Diagnosis, Multiple Diagnoses (Chronic)    Barriers to taking medications: No    Prior overnight hospital stay or ED visit in last 90 days: No    Readmission Within the Last 30 Days: no previous admission in last 30 days         Anticipated Changes Related to Illness: other (see comments) (tbd)    Equipment Needed After Discharge: none    Discharge Facility/Level of Care Needs: other (see comments) (tbd)    Readmission  Risk of Unplanned Readmission Score: UNPLANNED READMISSION SCORE: 9%  Predictive Model Details          9% (Low)  Factor Value    Calculated 11/21/2019 12:03 22% Active NSAID Rx order present    [facility] Risk of Unplanned Readmission Model 21% Number of active Rx orders 28     10% ECG/EKG order present in last 6 months     10% Latest calcium low (8.5 mg/dL)     7% Imaging order present in last 6 months     6% Number of ED visits in last six months 1     6% Age 64     5% Active anticoagulant Rx order present     5% Active corticosteroid Rx order present     5% Latest creatinine high (0.84 mg/dL)     2% Future appointment scheduled     1% Current length of stay 0.784 days      Readmitted Within the Last 30 Days? (No if blank)   Patient at risk for readmission?: No    Discharge Plan  Screen findings are: Care Manager reviewed the plan of the patient's care with the Multidisciplinary Team. No discharge planning needs identified at this time. Care Manager will continue to manage plan and monitor patient's progress with the team.    Expected Discharge Date: 11/21/2019    Expected Transfer from Critical Care:      Quality data for continuing care services shared with patient and/or representative?: Yes  Patient and/or family were provided with choice of facilities / services that are available and appropriate  to meet post hospital care needs?: Yes   List choices in order highest to lowest preferred, if applicable. : no preference    Initial Assessment complete?: Yes

## 2019-11-21 NOTE — Unmapped (Signed)
PHYSICAL THERAPY  Evaluation (11/21/19 0808)     Patient Name:  Teresa Sloan       Medical Record Number: [id number]   Date of Birth: Aug 07, 1955  Sex: Female            Treatment Diagnosis: decreased endurance    Activity Tolerance: Tolerated treatment well, Limited by fatigue    ASSESSMENT  Problem List: Decreased endurance, Decreased mobility, Shortness of breath     Assessment : Teresa Sloan is a 64 y.o. female with PMHx as noted below who presents to [facility] with COVID-19. Pt presents to PT demonstrating the ability to transfer and ambulate within-room distance with indep to SBA and no device, VSS. Pt educated re: energy conservation, pacing, and importance of continued daily mobility. Pt with no further skilled acute PT needs at this time. Recommend post-acute PT 3x/wk. After a review of the personal factors, comorbidities, clinical presentation, and examination of the number of affected body systems, the patient presents as a low complexity case.      Today's Interventions: Eval, ther act, pt ed: role of PT, importance of frequent short bouts of activity t/o day, OOB to chair, mobility progression, Rehab Therapies COVID Recovery Packet provided/reviewed, IS use, pacing, energy conservation, all questions answered.                          PLAN  Planned Frequency of Treatment:  D/C Services for: D/C Services      Post-Discharge Physical Therapy Recommendations:  3x weekly    PT DME Recommendations: None           Goals:   Patient and Family Goals: to discharge to daughter's home                                Prognosis:  Excellent  Positive Indicators: PLOF/CLOF, family support  Barriers to Discharge: Endurance deficits    SUBJECTIVE  Equipment / Environment: Patient not wearing mask for full session, Vascular access (PIV, TLC, Port-a-cath, PICC) (Ot wore appropriate PPE)  Patient reports: Pt/RN agreeable to PT. I don't want to be in that bed!  Current Functional Status: Pt presents uspine in bed. Session concluded with pt sitting in chair, all needs in reach, RN aware.     Prior Functional Status: Prior to onset of symptoms, pt reports she was indep with mobility and ADL without device. Over the past 3 weeks, endorses need for assistance with bathing, and reports she has been mostly staying in bed and only getting up to go to the bathroom (furniture walking), has not been cooking. Denies falls.  Equipment available at home: None     Past Medical History:   Diagnosis Date   ??? Anxiety    ??? Arthritis    ??? Depression    ??? GERD (gastroesophageal reflux disease)    ??? Hypertension    ??? Injury of posterior cruciate ligament    ??? Joint pain    ??? Psoriasis     Social History     Tobacco Use   ??? Smoking status: Never Smoker   ??? Smokeless tobacco: Never Used   Substance Use Topics   ??? Alcohol use: No     Alcohol/week: 0.0 standard drinks      Past Surgical History:   Procedure Laterality Date   ??? HIP SURGERY     ??? KNEE SURGERY  Family History   Problem Relation Age of Onset   ??? Cancer Father    ??? Breast cancer Mother    ??? Cancer Sister    ??? Breast cancer Sister    ??? Breast cancer Maternal Aunt    ??? Melanoma Neg Hx    ??? Basal cell carcinoma Neg Hx    ??? Squamous cell carcinoma Neg Hx         Allergies: Patient has no known allergies.                Objective Findings  Precautions / Restrictions  Precautions: Falls precautions, Isolation precautions (special air/contact)  Weight Bearing Status: Non-applicable  Required Braces or Orthoses: Non-applicable    Communication Preference: Verbal   Pain Comments: no c/o pain; endorses nausea, RN aware  Medical Tests / Procedures: vitals, labs, orders reviewed in Epic  Equipment / Environment: Patient not wearing mask for full session, Vascular access (PIV, TLC, Port-a-cath, PICC)    At Rest: VSS on RA  With Activity: HR 109, SpO2 94% on RA  Orthostatics: asymptomatic  Airway Clearance: mobility and up in chair; IS provided and educated re: use (max 750)    Living Situation  Living Environment: House  Lives With: Alone (plans to DC to stay with daughter)  Home Living: One level home, Stairs to enter without rails, Tub/shower unit, Standard height toilet, Shower chair with back (her tub is falling apart, uses her daughter's tub)  Number of Stairs to Enter (outside): 2     Cognition  Cognition: Within Medical laboratory scientific officer / Perception status  Visual/Perception: Wears Glasses/Contacts (wears glasses for reading)  Skin Inspection: Visible skin appears intact    UE ROM / Strength  UE ROM/Strength: Left Intact, Right Intact  LE ROM / Strength  LE ROM/Strength: Left Intact, Right Intact    Coordination: gross movement intact         Balance: static sitting indep; static standing with indep and no device         Bed Mobility: supine to sit indep  Transfers: sit<>stand with indep and no device   Gait  Level of Assistance: Standby assist, set-up cues, supervision of patient - no hands on  Assistive Device: None  Distance Ambulated (ft): 15 ft  Gait: amb 15 ft with SBA and no device, steady with no LOB         Endurance: decreased from baseline    Physical Therapy Session Duration  PT Individual [mins]: 17  Reason for Co-treatment: Poor activity tolerance    Medical Staff Made Aware: RN    I attest that I have reviewed the above information.  Signed: Aldean Ast, PT  Filed 11/21/2019

## 2019-11-21 NOTE — Unmapped (Signed)
[facility] Medicine  History and Physical    Assessment/Plan:    Principal Problem:    COVID-19  Active Problems:    Psoriasis    HTN (hypertension)    Class 2 obesity due to excess calories without serious comorbidity with body mass index (BMI) of 36.0 to 36.9 in adult    Malaise and fatigue      Teresa Sloan is a 64 y.o. female with PMHx as noted below who presents to [facility] with COVID-19.    Acute COVID-19 Infection:        Date of symptom onset: 10/27/19       Date of positive SARS-CoV-2 PCR: 11/20/19       Exposure (include occupation; # family members in home, family exposures): unk       Vaccination status: Not vaccinated - interested in COVID vaccine       Therapy: Therapeutic-dose anticoagulation. Doesn't meet criteria for dex, rem, or casi/imd       Supplemental Oxygen: none  - Wean oxygen to maintain SpO2 92-96%   - Admission labs: CBC w/ diff, CMP, CRP, troponin, ddimer, LDH, T&S, HIV ab  - Tylenol for fever; Ok to add NSAIDs if fever is not responding to Tylenol alone  - No need for telemetry monitoring unless on HFNC or other clinical indication  - If oxygen requirement or concern for respiratory decline, continuous pulse ox    Hypercoagulable state in the setting of COVID-19: Patient is at increased risk for thrombosis in the setting of acute COVID-19 infection. Prospective trials have shown that patients with COVID pneumonia on low-flow oxygen have decreased mortality/progression to organ support if treated with therapeutic-dose anticoagulation. The best treatment for patients who do progress to needing Stepdown/ICU care is not known.  - Patient is not on oxygen and should receive standard VTE pharmacologic prophylaxis.  - Standard prophylaxis and BMI >=40: enoxaparin 40 mg Q12 hours or if GFR <30 heparin 7500 units Q8 hours    Other Medical Problems:  Malaise and Dyspnea on exertion  - BNP normal, CRP normal  - not likely to be CHF.  - will treat with IVFs and anti-emetics and see if strength improves  - suspect most of this is deconditioning in a woman who lives alone  - PT/OT and possibly to SNF for rehab    Dysphagia  - follow to see if may need esophageal imaging or GI c/s.     Advanced care planning  - Code status: full  - Healthcare proxy: daughter    FEN/GI/PPX  - Diet: regular  - IVF: LR x 20 hrs  - Bowel regimen: senna & miralax prn  - GI ppx: n/a  - Incentive spirometry    Dispo: floor    > 50% of this encounter 90 minute encounter was spent on counseling and coordination of care involving covid  ___________________________________________________________________    Chief Complaint  Chief Complaint   Patient presents with   ??? Nausea       HPI:  Teresa Sloan is a 64 y.o. female with HTN, obesity, and psoriasis on immunosuppression who presents to [facility] with COVID-19.    Well until 3 weeks ago when developed marked fatigue and myalgias, weakness, mild to moderate DOE. Tested positive at a drive-through clinic, but did not receive treatment. Has been largely bedbound x 3 weeks, with emesis nightly and also 3 loose stools daily. No smell. Not eating well. Feels as if food getting stuck at times. Not  sure about weight loss. No blood in stool or vomit. No recent fevers. Primary symptom today is severe fatigue and myalgias.    In the ED, test was positive again and rec'd fluids. No need for supplemental O2, but did not feel comfortable going home as lives alone.        Allergies:  Patient has no known allergies.     Medications:   Prior to Admission medications    Medication Dose, Route, Frequency   amLODIPine (NORVASC) 10 MG tablet 10 mg, Oral, Daily (standard)   ammonium lactate (LAC-HYDRIN) 12 % lotion 1 application, Topical, Daily (RT)   azithromycin (ZITHROMAX) 500 MG tablet No dose, route, or frequency recorded.   cefdinir (OMNICEF) 300 MG capsule No dose, route, or frequency recorded.   ciclopirox (PENLAC) 8 % solution Apply over nail and surrounding skin. Apply daily over previous coat. After seven (7) days, may remove with alcohol and continue cycle.   clindamycin (CLEOCIN T) 1 % lotion Topical, 2 times a day (standard), To affected areas in armpit, groin, abdomen x 3-4 weeks, less if resolved   clobetasoL (TEMOVATE) 0.05 % ointment Apply to thick areas of psoriasis twice daily as needed. Avoid face and skin folds   empty container (SHARPS-A-GATOR DISPOSAL SYSTEM) Misc use to dispose of needles   empty container Misc Use as directed to dispose of Cosentyx pens.   fluconazole (DIFLUCAN) 150 MG tablet No dose, route, or frequency recorded.   folic acid (FOLVITE) 1 MG tablet 1 mg, Oral, Daily (standard)   hydroCHLOROthiazide (HYDRODIURIL) 25 MG tablet 25 mg, Oral, Daily (standard)   lisinopriL (PRINIVIL,ZESTRIL) 20 MG tablet 20 mg, Oral, Daily (standard), For high blood pressure   meloxicam (MOBIC) 15 MG tablet 15 mg, Oral, Daily (standard)   methocarbamol (ROBAXIN) 750 MG tablet 750 mg, 4 times a day   methotrexate 2.5 MG tablet 15 mg, Oral, Weekly, (start 4 tablets/week for the first 2 weeks)   secukinumab (COSENTYX PEN, 2 PENS,) 150 mg/mL PnIj injection Inject the contents of 2 pens (300 mg) under the skin every 4 weeks.   secukinumab (COSENTYX) 150 mg/mL PnIj injection inject the contents of 2 pens (300 mg) udner the skin once weekly at weeks 0, 1, 2, 3, and 4. then inject the contents of 2 pens (300 mg) every 4 weeks   tretinoin (RETIN-A) 0.05 % cream Apply thin layer to underarms a few nights a week. Increase as tolerated.   triamcinolone (KENALOG) 0.1 % ointment Apply to thin areas of psoriasis twice daily as needed   valACYclovir (VALTREX) 500 MG tablet 500 mg       Medical History:  Past Medical History:   Diagnosis Date   ??? Anxiety    ??? Arthritis    ??? Depression    ??? GERD (gastroesophageal reflux disease)    ??? Hypertension    ??? Injury of posterior cruciate ligament    ??? Joint pain    ??? Psoriasis        Surgical History:  Past Surgical History:   Procedure Laterality Date   ??? HIP SURGERY     ??? KNEE SURGERY         Social History:  Social History     Socioeconomic History   ??? Marital status: Single     Spouse name: Not on file   ??? Number of children: Not on file   ??? Years of education: Not on file   ??? Highest education level: Not on file  Occupational History   ??? Not on file   Tobacco Use   ??? Smoking status: Never Smoker   ??? Smokeless tobacco: Never Used   Substance and Sexual Activity   ??? Alcohol use: No     Alcohol/week: 0.0 standard drinks   ??? Drug use: No   ??? Sexual activity: Yes     Partners: Male   Other Topics Concern   ??? Do you use sunscreen? No   ??? Tanning bed use? No   ??? Are you easily burned? No   ??? Excessive sun exposure? No   ??? Blistering sunburns? No   Social History Narrative   ??? Not on file         Family History:  Family History   Problem Relation Age of Onset   ??? Cancer Father    ??? Breast cancer Mother    ??? Cancer Sister    ??? Breast cancer Sister    ??? Breast cancer Maternal Aunt    ??? Melanoma Neg Hx    ??? Basal cell carcinoma Neg Hx    ??? Squamous cell carcinoma Neg Hx        Review of Systems:  10 systems reviewed and are negative unless otherwise mentioned in HPI      Physical Exam:  Temp:  [36.1 ??C (97 ??F)] 36.1 ??C (97 ??F)  Heart Rate:  [93-127] 93  SpO2 Pulse:  [99-101] 99  Resp:  [12-18] 18  BP: (118-133)/(69-90) 118/69  SpO2:  [95 %-100 %] 95 %  There is no height or weight on file to calculate BMI.    General: Fatigued and mildly ill appearing, alert, conversant, cooperative, and in no distress.  Eyes: No scleral icterus, mild conj injection B  ENT: Normal appearing nose. Oropharyngeal mucus membranes moist & pink.  Neck: No lymphadenopathy. No JVD  Cardiovascular: Normal rate, regular rhythm. No murmur. Normal JVP.  Extremities: Warm to touch. Regular 2+ radial pulses bilaterally. No LE edema.  Respiratory: Normal respiratory effort on room air. Clear to auscultation bilaterally, but distant  Gastrointestinal: Soft, non-tender, non-distended with normoactive bowel sounds.  Neurologic: Alert and fully oriented. Normal speech and language. Moving all extremities purposefully. No focal deficits.  Skin: Skin is warm, dry and intact. No rash.  Psychiatric: Mood and affect are normal. Speech and behavior are normal.      Test Results:  Data Review:    All lab results last 24 hours:    Recent Results (from the past 24 hour(s))   ECG 12 Lead    Collection Time: 11/20/19 10:08 AM   Result Value Ref Range    EKG Systolic BP  mmHg    EKG Diastolic BP  mmHg    EKG Ventricular Rate 122 BPM    EKG Atrial Rate 122 BPM    EKG P-R Interval 116 ms    EKG QRS Duration 82 ms    EKG Q-T Interval 296 ms    EKG QTC Calculation 421 ms    EKG Calculated P Axis 50 degrees    EKG Calculated R Axis 25 degrees    EKG Calculated T Axis 36 degrees    QTC Fredericia 374 ms   hsTroponin I (single, no delta)    Collection Time: 11/20/19 10:17 AM   Result Value Ref Range    hsTroponin I 3 <=34 ng/L   Comprehensive Metabolic Panel    Collection Time: 11/20/19 10:17 AM   Result Value Ref Range    Sodium  131 (L) 135 - 145 mmol/L    Potassium 3.1 (L) 3.4 - 4.5 mmol/L    Chloride 96 (L) 98 - 107 mmol/L    Anion Gap 12 5 - 14 mmol/L    CO2 23.0 20.0 - 31.0 mmol/L    BUN 16 9 - 23 mg/dL    Creatinine 1.61 (H) 0.60 - 0.80 mg/dL    BUN/Creatinine Ratio 15     EGFR CKD-EPI Non-African American, Female 55 (L) >=60 mL/min/1.61m2    EGFR CKD-EPI African American, Female 66 >=60 mL/min/1.74m2    Glucose 207 (H) 70 - 179 mg/dL    Calcium 9.3 8.7 - 09.6 mg/dL    Albumin 3.5 3.4 - 5.0 g/dL    Total Protein 8.1 5.7 - 8.2 g/dL    Total Bilirubin 1.0 0.3 - 1.2 mg/dL    AST 37 (H) <=04 U/L    ALT 32 10 - 49 U/L    Alkaline Phosphatase 105 46 - 116 U/L   B-type Natriuretic Peptide    Collection Time: 11/20/19 10:17 AM   Result Value Ref Range    BNP 15 <=100 pg/mL   CBC w/ Differential    Collection Time: 11/20/19 10:17 AM   Result Value Ref Range    WBC 10.4 4.5 - 11.0 10*9/L    RBC 5.60 (H) 4.00 - 5.20 10*12/L    HGB 16.8 (H) 12.0 - 16.0 g/dL    HCT 54.0 (H) 98.1 - 46.0 %    MCV 88.5 80.0 - 100.0 fL    MCH 30.0 26.0 - 34.0 pg    MCHC 34.0 31.0 - 37.0 g/dL    RDW 19.1 47.8 - 29.5 %    MPV 9.2 7.0 - 10.0 fL    Platelet 340 150 - 440 10*9/L    Neutrophils % 88.5 %    Lymphocytes % 6.7 %    Monocytes % 2.8 %    Eosinophils % 0.2 %    Basophils % 0.4 %    Neutrophil Left Shift 1+ (A) Not Present    Absolute Neutrophils 9.2 (H) 2.0 - 7.5 10*9/L    Absolute Lymphocytes 0.7 (L) 1.5 - 5.0 10*9/L    Absolute Monocytes 0.3 0.2 - 0.8 10*9/L    Absolute Eosinophils 0.0 0.0 - 0.4 10*9/L    Absolute Basophils 0.1 0.0 - 0.1 10*9/L    Large Unstained Cells 1 0 - 4 %   D-Dimer, Quantitative    Collection Time: 11/20/19 10:17 AM   Result Value Ref Range    D-Dimer 1,556 (H) <=500 ng/mL FEU   COVID-19 PCR    Collection Time: 11/20/19 10:17 AM    Specimen: Nasopharyngeal Swab   Result Value Ref Range    SARS-CoV-2 PCR Positive (A) Negative    SARS-CoV-2 CT Value 31.6        Imaging: Radiology studies were personally reviewed    EKG: EKG was personally reviewed and noted to be, normal EKG, normal sinus rhythm and unchanged from previous tracing

## 2019-11-21 NOTE — Unmapped (Signed)
Physician Discharge Summary [facility]  6 [address] [facility]  [address]  [place] [address]  Dept: [phone]  Loc: [phone]     Identifying Information:   Teresa Sloan  10-19-1955  [id number]    Primary Care Physician: Rayvon Char, MD   Code Status: Full Code    Admit Date: 11/20/2019    Discharge Date: 11/21/2019     Discharge To: Home with Home Health and/or PT/OT    Discharge Service: [facility] - Hospitalist [facility] [facility]     Discharge Attending Physician: Terri Piedra, AGNP    Discharge Diagnoses:  Principal Problem:    COVID-19 POA: Unknown  Active Problems:    Psoriasis POA: Yes    HTN (hypertension) POA: Yes    Class 2 obesity due to excess calories without serious comorbidity with body mass index (BMI) of 36.0 to 36.9 in adult POA: Not Applicable    Malaise and fatigue POA: Yes  Resolved Problems:    * No resolved hospital problems. [facility] Course:   Presented with 3 weeks of progressive fatigue and dyspnea on exertion secondary to COVID-19.  Per her report about 3 weeks prior to this presentation she developed marked fatigue, myalgia, weakness and DOE.  She tested positive for COVID-19 at [facility] at that time. Her DOE and fatigue progressed at home to the point she was largely bedbound x 3 weeks, with emesis nightly and also 3 loose stools daily.  She presented to [facility] ED on 11/20/19. At that time labs were notable for sodium 121, potassium 3.1, creatinine 1.07. BNP was normal.  CTA was negative for PE but consistent with multifocal pneumonia. Her vital signs were stable and she did not require supplemental oxygen.  She was admitted overnight and received IV fluids. Her sodium improved to 133, K 3.5, and creatinine 0.84 the following morning.   Her fatigue and DOE improved. She was evaluated by PT and OT and was discharged to her daughter's house with 3x weekly home health.     On admission, she also complained of dysphagia stating sometimes food gets stuck. Suspect this may be related to fatigue and eating in reclined position as she was able to tolerate a regular diet without complications this admission.     Procedures:     No admission procedures for hospital encounter.  ______________________________________________________________________  Discharge Medications:     Medication List      CONTINUE taking these medications    ??? amLODIPine 10 MG tablet; Commonly known as: NORVASC; Take 1 tablet (10   mg total) by mouth daily.  ??? ammonium lactate 12 % lotion; Commonly known as: LAC-HYDRIN; Apply 1   application topically once daily.  ??? ciclopirox 8 % solution; Commonly known as: PENLAC; Apply over nail and   surrounding skin. Apply daily over previous coat. After seven (7) days,   may remove with alcohol and continue cycle.  ??? clindamycin 1 % lotion; Commonly known as: CLEOCIN T; Apply topically   Two (2) times a day. To affected areas in armpit, groin, abdomen x 3-4   weeks, less if resolved  ??? clobetasoL 0.05 % ointment; Commonly known as: TEMOVATE; Apply to thick   areas of psoriasis twice daily as needed. Avoid face and skin folds  ??? * empty container Misc; Commonly known as: SHARPS-A-GATOR DISPOSAL   SYSTEM; use to dispose of needles  ??? * empty container Misc; Use as directed to dispose of Cosentyx pens.  ???  folic acid 1 MG tablet; Commonly known as: FOLVITE; Take 1 tablet (1 mg   total) by mouth daily.  ??? hydroCHLOROthiazide 25 MG tablet; Commonly known as: HYDRODIURIL; Take 1   tablet (25 mg total) by mouth daily.  ??? lisinopriL 20 MG tablet; Commonly known as: PRINIVIL,ZESTRIL; Take 1   tablet (20 mg total) by mouth daily. For high blood pressure  ??? meloxicam 15 MG tablet; Commonly known as: MOBIC; Take 1 tablet (15 mg   total) by mouth daily.  ??? methocarbamoL 750 MG tablet; Commonly known as: ROBAXIN  ??? methotrexate 2.5 MG tablet; Take 6 tablets (15 mg total) by mouth once a   week. (start 4 tablets/week for the first 2 weeks)  ??? * secukinumab 150 mg/mL Pnij injection; Commonly known as: COSENTYX;   inject the contents of 2 pens (300 mg) udner the skin once weekly at weeks   0, 1, 2, 3, and 4. then inject the contents of 2 pens (300 mg) every 4   weeks  ??? * COSENTYX PEN (2 PENS) 150 mg/mL Pnij injection; Generic drug:   secukinumab; Inject the contents of 2 pens (300 mg) under the skin every 4   weeks.  ??? tretinoin 0.05 % cream; Commonly known as: RETIN-A; Apply thin layer to   underarms a few nights a week. Increase as tolerated.  ??? triamcinolone 0.1 % ointment; Commonly known as: KENALOG; Apply to thin   areas of psoriasis twice daily as needed  ??? VALTREX 500 MG tablet; Generic drug: valACYclovir  * This list has 4 medication(s) that are the same as other medications   prescribed for you. Read the directions carefully, and ask your doctor or   other care provider to review them with you.       Allergies:  Patient has no known allergies.  ______________________________________________________________________  Pending Test Results (if blank, then none):      Most Recent Labs:  All lab results last 24 hours -   Recent Results (from the past 24 hour(s))   Comprehensive metabolic panel    Collection Time: 11/21/19  6:57 AM   Result Value Ref Range    Sodium 133 (L) 135 - 145 mmol/L    Potassium 3.5 3.4 - 4.5 mmol/L    Chloride 98 98 - 107 mmol/L    Anion Gap 7 5 - 14 mmol/L    CO2 28.0 20.0 - 31.0 mmol/L    BUN 12 9 - 23 mg/dL    Creatinine 1.61 (H) 0.60 - 0.80 mg/dL    BUN/Creatinine Ratio 14     EGFR CKD-EPI Non-African American, Female 44 >=60 mL/min/1.54m2    EGFR CKD-EPI African American, Female 20 >=60 mL/min/1.61m2    Glucose 105 70 - 179 mg/dL    Calcium 8.5 (L) 8.7 - 10.4 mg/dL    Albumin 2.6 (L) 3.4 - 5.0 g/dL    Total Protein 6.3 5.7 - 8.2 g/dL    Total Bilirubin 0.8 0.3 - 1.2 mg/dL    AST 27 <=09 U/L    ALT 21 10 - 49 U/L    Alkaline Phosphatase 80 46 - 116 U/L   hsTroponin I (single, no delta)    Collection Time: 11/21/19  6:57 AM   Result Value Ref Range    hsTroponin I 4 <=34 ng/L       Relevant Studies/Radiology (if blank, then none):  ECG 12 Lead    Result Date: 11/20/2019  SINUS TACHYCARDIA MINIMAL VOLTAGE CRITERIA FOR LVH, MAY  BE NORMAL VARIANT NONSPECIFIC ST ABNORMALITY NO PREVIOUS ECGS AVAILABLE Confirmed by Schuyler Amor [phone]) on 11/20/2019 11:08:16 AM    XR Chest Portable    Result Date: 11/20/2019  EXAM: XR CHEST PORTABLE DATE: 11/20/2019 ACCESSION: [id number] UN DICTATED: 11/20/2019 10:36 AM INTERPRETATION LOCATION: [facility] CLINICAL INDICATION: 64 years old Female with DYSPNEA  COMPARISON: 05/29/2018 TECHNIQUE: PA and Lateral Chest Radiographs. FINDINGS: Hyponflated lungs. Bilateral linear opacities and Kerley B lines. No pleural effusion or pneumothorax. Unremarkable cardiomediastinal silhouette.     Mild pulmonary edema.    CTA Chest W Contrast    Addendum Date: 11/20/2019    **Please note that the patient's first peripheral IV had infiltrated after the administration of approximately 20 ml of saline. Examination demonstrated a 3 x 3 cm subcutaneous collection along the right antecubital fossa. Patient denied any pain, paresthesias. Neurovascular exam intact. The study was able to be finished to completion without difficulty after placement of a second IV. Potential, albeit low, for compartment syndrome, necrosis, and/or myositis/tenosynovitis due to the contrast were explained to the patient. The patient was informed to return to the ED if any symptoms progressed. The patient was discharged home after being stable and asymptomatic for 30 minutes post event. The findings of this study were discussed via telephone with DR. MICHAEL S AMBROSE by Dr. Arnaldo Natal on 11/20/2019 12:22 PM.    Result Date: 11/20/2019  EXAM: CTA CHEST W CONTRAST DATE: 11/20/2019 12:05 PM ACCESSION: [id number] UN DICTATED: 11/20/2019 12:14 PM INTERPRETATION LOCATION: [facility] CLINICAL INDICATION: 64 years old Female with evaluate for PE ; PE suspected, low/intermediate prob, positive D - dimer  COMPARISON: None TECHNIQUE: A helical CTA scan was obtained with IV contrast from the lung apices to the lung bases. Images were reconstructed in the axial plane. Multiplanar reformatted and MIP images were provided. FINDINGS: PULMONARY ARTERIES: No pulmonary embolism. AIRWAYS, LUNGS, PLEURA: Clear central airways. No pleural effusion. Peripheral predominant multifocal ground-glass opacities involving all lung lobes. MEDIASTINUM: Normal heart size. No pericardial effusion.  Normal caliber thoracic aorta.  No mediastinal lymphadenopathy. Small hiatal hernia. IMAGED ABDOMEN: Unremarkable. SOFT TISSUES: Unremarkable. BONES: Unremarkable.     No pulmonary embolism. Multifocal pneumonia.    ______________________________________________________________________  Discharge Instructions:   Activity Instructions     Activity as tolerated            Diet Instructions     Discharge diet (specify)      Discharge Nutrition Therapy: Regular          Other Instructions     Call MD for:  difficulty breathing, headache or visual disturbances      Call MD for:  extreme fatigue      Call MD for:  hives      Call MD for:  persistent dizziness or light-headedness      Call MD for:  persistent nausea or vomiting      Call MD for:  severe uncontrolled pain      Call MD for: Temperature > 38.5 Celsius ( > 101.3 Fahrenheit)      No dressing needed            Follow Up instructions and Outpatient Referrals     Call MD for:  difficulty breathing, headache or visual disturbances      Call MD for:  extreme fatigue      Call MD for:  hives      Call MD for:  persistent dizziness or light-headedness      Call MD for:  persistent nausea or vomiting      Call MD for:  severe uncontrolled pain      Call MD for: Temperature > 38.5 Celsius ( > 101.3 Fahrenheit)            Appointments which have been scheduled for you    Nov 28, 2019  3:00 PM  (Arrive by 2:45 PM)  RETURN  RHEUMATOLOGY with Rockwell Alexandria, FNP  [facility] [place] [facility]) [address]  [place] [address]  [phone]           ______________________________________________________________________  Discharge Day Services:  BP 132/76  - Pulse 99  - Temp 36 ??C (96.8 ??F) (Oral)  - Resp 18  - Ht 175.3 cm (5' 9)  - Wt (!) 111.1 kg (244 lb 14.9 oz)  - SpO2 97%  - BMI 36.17 kg/m??   Pt seen on the day of discharge and determined appropriate for discharge.    Condition at Discharge: good    Length of Discharge: I spent greater than 30 mins in the discharge of this patient.

## 2019-11-21 NOTE — Unmapped (Signed)
No acute events. Patient received medications as ordered. Zofran administered for nausea. Ate some breakfast. Reported an improvement overall today. Up to chair. Worked with PT/OT. Vitals stable on room air. Plan for patient to be discharged home today. Patient to arrange a ride from family.   Problem: Infection  Goal: Absence of Infection Signs and Symptoms  Outcome: Discharged to Home  Intervention: Prevent or Manage Infection  Recent Flowsheet Documentation  Taken 11/21/2019 0800 by Mercie Eon, RN  Isolation Precautions: spec airborne/contact precautions maintained     Problem: Adult Inpatient Plan of Care  Goal: Plan of Care Review  Outcome: Discharged to Home  Goal: Patient-Specific Goal (Individualized)  Outcome: Discharged to Home  Goal: Absence of Hospital-Acquired Illness or Injury  Outcome: Discharged to Home  Intervention: Identify and Manage Fall Risk  Recent Flowsheet Documentation  Taken 11/21/2019 0800 by Mercie Eon, RN  Safety Interventions:   low bed   lighting adjusted for tasks/safety   fall reduction program maintained   bleeding precautions  Intervention: Prevent and Manage VTE (Venous Thromboembolism) Risk  Recent Flowsheet Documentation  Taken 11/21/2019 0800 by Mercie Eon, RN  Activity Management:   activity adjusted per tolerance   activity encouraged   up in chair  Goal: Optimal Comfort and Wellbeing  Outcome: Discharged to Home  Goal: Readiness for Transition of Care  Outcome: Discharged to Home  Goal: Rounds/Family Conference  Outcome: Discharged to Home     Problem: Fall Injury Risk  Goal: Absence of Fall and Fall-Related Injury  Outcome: Discharged to Home  Intervention: Promote Injury-Free Environment  Recent Flowsheet Documentation  Taken 11/21/2019 0800 by Mercie Eon, RN  Safety Interventions:   low bed   lighting adjusted for tasks/safety   fall reduction program maintained   bleeding precautions     Problem: Hypertension Comorbidity  Goal: Blood Pressure in Desired Range  Outcome: Discharged to Home

## 2019-11-21 NOTE — Unmapped (Signed)
Alert and oriented X4.  Remains on room air.  Denies pain.  VSS.  No acute changes.  Given meds per [facility].  LR continuous at 145ml/hr.  Call bell and belongings within reach.  POC maintained.      Problem: Infection  Goal: Absence of Infection Signs and Symptoms  Outcome: Ongoing - Unchanged     Problem: Adult Inpatient Plan of Care  Goal: Plan of Care Review  Outcome: Ongoing - Unchanged  Goal: Patient-Specific Goal (Individualized)  Outcome: Ongoing - Unchanged  Goal: Absence of Hospital-Acquired Illness or Injury  Outcome: Ongoing - Unchanged  Goal: Optimal Comfort and Wellbeing  Outcome: Ongoing - Unchanged  Goal: Readiness for Transition of Care  Outcome: Ongoing - Unchanged  Goal: Rounds/Family Conference  Outcome: Ongoing - Unchanged     Problem: Fall Injury Risk  Goal: Absence of Fall and Fall-Related Injury  Outcome: Ongoing - Unchanged     Problem: Hypertension Comorbidity  Goal: Blood Pressure in Desired Range  Outcome: Ongoing - Unchanged

## 2019-11-21 NOTE — Unmapped (Signed)
OCCUPATIONAL THERAPY  Evaluation (11/21/19 0809)    Patient Name:  Teresa Sloan       Medical Record Number: [id number]   Date of Birth: 05-07-1955  Sex: Female          OT Treatment Diagnosis:  decreased activity tolerance, impacting ADl performance/safety    Assessment  Problem List: Decreased endurance    Assessment: Tyreesha ARYAN BELLO is a 64 y.o. female with PMHx as noted below who presents to [facility] with COVID-19. At baseline, patient was ind. in ADLs,  IADLs, and  functional mobility. Patient is now limited by problem list as mentioned above, impacting ADL/functional mobility performance and safety. Pt requiring SBA-supervision for all ADL/fx mobility, limited by poor activity tolerance, DC acute OT services, recommend continued skilled OT services 3 times per week upon discharge to address prolonged deconditioning. After review of the patient's occupational profile and history, assessment of occupational performance, clinical decision making, and development of POC, the patient presents as a moderate complexity case.    Today's Interventions: OT evaluation, bed mobility, functional transfers, functional mobility, toilet transfer, self feeding, lower body dressing, grooming tasks, sitting tolerance, standing tolerance and cognition. Patient educated on role of OT, POC, safety, importance of RN/therapy assist with OOB mobility, benefits of early ADL engagement, fall prevention techniques (reducing clutter, removing throw rugs, adequate lighting, use of AD, seated ADL tasks), energy conservation (pursed lip breathing, pacing, seated vs. standing ADL activities), covid education    Activity Tolerance During Today's Session  Tolerated treatment well    Plan  Planned Frequency of Treatment:  D/C Services for: D/C Services       Planned Interventions:       Post-Discharge Occupational Therapy Recommendations:  3x weekly   OT DME Recommendations: Three in one commode    GOALS:   Patient and Family Goals: go home Short Term:                                       Prognosis:  Good  Positive Indicators:  P/CLOF, family support, motivation  Barriers to Discharge: Endurance deficits    Subjective  Current Status Pt rec'd sup in bed, left in recliner chair, all needs met, call bell within reach, RN updated  Prior Functional Status PTA, pt reports being grossly ind. with ADL/fx mobility/IADls. Pt with progressive weakness and spending most of her time in the bed, getting up to the bathroom only , daughter assists with iADLs/bathing.            Patient / Caregiver reports: I'm doing better but everything just takes a lot of effort    Past Medical History:   Diagnosis Date   ??? Anxiety    ??? Arthritis    ??? Depression    ??? GERD (gastroesophageal reflux disease)    ??? Hypertension    ??? Injury of posterior cruciate ligament    ??? Joint pain    ??? Psoriasis     Social History     Tobacco Use   ??? Smoking status: Never Smoker   ??? Smokeless tobacco: Never Used   Substance Use Topics   ??? Alcohol use: No     Alcohol/week: 0.0 standard drinks      Past Surgical History:   Procedure Laterality Date   ??? HIP SURGERY     ??? KNEE SURGERY      Family History  Problem Relation Age of Onset   ??? Cancer Father    ??? Breast cancer Mother    ??? Cancer Sister    ??? Breast cancer Sister    ??? Breast cancer Maternal Aunt    ??? Melanoma Neg Hx    ??? Basal cell carcinoma Neg Hx    ??? Squamous cell carcinoma Neg Hx         Patient has no known allergies.     Objective Findings  Precautions / Restrictions  Falls precautions, Isolation precautions (special air/contact)    Weight Bearing  Non-applicable    Required Braces or Orthoses  Non-applicable    Communication Preference  Verbal    Pain  no c/o pain    Equipment / Environment  Patient not wearing mask for full session, Vascular access (PIV, TLC, Port-a-cath, PICC) (Ot wore appropriate PPE)    Living Situation  Living Environment: House  Lives With: Alone (plans to DC to stay with daughter)  Home Living: One level home, Stairs to enter without rails, Tub/shower unit, Standard height toilet, Shower chair with back (her tub is falling apart, uses her daughter's tub)  Number of Stairs to Enter (outside): 2  Equipment available at home: None     Cognition   Orientation Level:  Oriented x 4   Arousal/Alertness:  Appropriate responses to stimuli   Attention Span:  Appears intact   Memory:  Appears intact   Following Commands:  Follows all commands and directions without difficulty   Safety Judgment:  Good awareness of safety precautions   Awareness of Errors:  Good awareness of safety precautions   Problem Solving:  Able to problem solve independently   Comments:      Vision / Perception    Hearing: WFL   Vision: Wears glasses for reading only  Perception: WFL  Comments: not in room, blurry vision while reading    Hand Function  Hand Dominance: R  B gross grasp WFL    Skin Inspection  c/d/i    ROM / Strength/Coordination  UE ROM/ Strength/ Coordination: WFL  LE ROM/ Strength/ Coordination: WFL    Sensation:  intact    Balance:  supervision    Functional Mobility  Transfer Assistance Needed: No (sit <> stand, simulated toilet t/f: SBA-supervision)  Bed Mobility Assistance Needed: No (sup > sit: supervision)  Ambulation: fx mobility within room to simulate home distances: supervision      ADLs  ADLs: Supervision  IADLs: NT      Vitals / Orthostatics  At Rest: VSS on RA  With Activity: VSS  Orthostatics: slight DOE, no decrease in O2 sats             Occupational Therapy Session Duration  OT Individual [mins]: 16  Reason for Co-treatment: Poor activity tolerance         I attest that I have reviewed the above information.  Signed: Merril Abbe, OT  Filed 11/21/2019

## 2019-11-26 NOTE — Unmapped (Signed)
Message left for Patient to schedule SOC. Staff reports that two additional attempts have been made. Unable to reach Patient's CG as her number is disconnected.

## 2019-11-28 NOTE — Unmapped (Signed)
3rd attempt made to schedule soc, no answer multiple voice mails left. Referral to be discarded

## 2019-11-28 NOTE — Unmapped (Unsigned)
REASON FOR VISIT: F/U joint pain    HISTORY: Teresa Sloan is a 64 y.o. 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 She established with Dr. Delton See 12/07/2018 via telephone since video wouldn't work. At this visit she was joint pain to DIP joints, ankles and lower back after recent discontinuation of MTX therapy. Since this visit was completed by telephone difficult to know if she had active disease and history suggestive of OA. Hand xrays consistent with OA but due to inflammatory markers being elevated MTX restarted at 15mg /week.     Previously she was evaluated by [facility] Rheumatology in 2006 with Dr. Luster Landsberg, who at that time felt she had Synovitis of b/l IP joints and suspected she had underlying Psoriatic Arthritis. He performed intraarticular steroid injections and recommended continuing MTX. She was not seen again at [facility] for further follow up with Rheumatology.     Interim history:    MTX?  FA?    Joints?    Cosentyx?    Psoriasis?    COVID 19?        CURRENT MEDICATIONS:  Current Outpatient Medications   Medication Sig Dispense Refill   ??? amLODIPine (NORVASC) 10 MG tablet Take 1 tablet (10 mg total) by mouth daily. 30 tablet 11   ??? ammonium lactate (LAC-HYDRIN) 12 % lotion Apply 1 application topically once daily. 400 g 6   ??? ciclopirox (PENLAC) 8 % solution Apply over nail and surrounding skin. Apply daily over previous coat. After seven (7) days, may remove with alcohol and continue cycle. 6.6 mL 1   ??? clindamycin (CLEOCIN T) 1 % lotion Apply topically Two (2) times a day. To affected areas in armpit, groin, abdomen x 3-4 weeks, less if resolved 120 mL 2   ??? clobetasoL (TEMOVATE) 0.05 % ointment Apply to thick areas of psoriasis twice daily as needed. Avoid face and skin folds 60 g 3   ??? empty container (SHARPS-A-GATOR DISPOSAL SYSTEM) Misc use to dispose of needles 1 each 2   ??? empty container Misc Use as directed to dispose of Cosentyx pens. 1 each 2   ??? folic acid (FOLVITE) 1 MG tablet Take 1 tablet (1 mg total) by mouth daily. 90 tablet 1   ??? hydroCHLOROthiazide (HYDRODIURIL) 25 MG tablet Take 1 tablet (25 mg total) by mouth daily. 30 tablet 11   ??? lisinopriL (PRINIVIL,ZESTRIL) 20 MG tablet Take 1 tablet (20 mg total) by mouth daily. For high blood pressure 30 tablet 2   ??? meloxicam (MOBIC) 15 MG tablet Take 1 tablet (15 mg total) by mouth daily. 14 tablet 0   ??? methocarbamol (ROBAXIN) 750 MG tablet Take 750 mg by mouth Four (4) times a day.     ??? methotrexate 2.5 MG tablet Take 6 tablets (15 mg total) by mouth once a week. (start 4 tablets/week for the first 2 weeks) 72 tablet 1   ??? secukinumab (COSENTYX PEN, 2 PENS,) 150 mg/mL PnIj injection Inject the contents of 2 pens (300 mg) under the skin every 4 weeks. 2 mL 11   ??? secukinumab (COSENTYX) 150 mg/mL PnIj injection inject the contents of 2 pens (300 mg) udner the skin once weekly at weeks 0, 1, 2, 3, and 4. then inject the contents of 2 pens (300 mg) every 4 weeks 14 mL 0   ??? tretinoin (RETIN-A) 0.05 % cream Apply thin layer to underarms a few nights a week. Increase as tolerated. 45  g 5   ??? triamcinolone (KENALOG) 0.1 % ointment Apply to thin areas of psoriasis twice daily as needed 80 g 6   ??? valACYclovir (VALTREX) 500 MG tablet Take 500 mg by mouth daily. Frequency:PHARMDIR   Dosage:500   MG  Instructions:  Note:take one tablet twice a day for three days whenever have outbreak of fever blisters Dose: 500MG        No current facility-administered medications for this visit.       Past Medical History:   Diagnosis Date   ??? Anxiety    ??? Arthritis    ??? Depression    ??? GERD (gastroesophageal reflux disease)    ??? Hypertension    ??? Injury of posterior cruciate ligament    ??? Joint pain    ??? Psoriasis         Record Review: Available records were reviewed, including pertinent office visits, labs, and imaging.      REVIEW OF SYSTEMS: Ten system were reviewed and negative except as noted above.    PHYSICAL EXAM:***  VITAL SIGNS: There were no vitals filed for this visit.  General:   Pleasant 64 y.o.female in no acute distress, WDWN   Eyes:   PERRL, conjunctiva and sclera not inflamed. Tears appear adequate.    ENT:   No oropharyngeal lesions. Mucous membranes moist.    Lymph:   No masses or cervical lymphadenopathy.    Cardiovascular:  Regular rate and rhythm. No murmur, rub, or gallop. No lower extremity edema.    Lungs:  Clear to auscultation.Normal respiratory effort.    Musculoskeletal:   General: Ambulates w/o assistance   Hands: No swelling or tenderness. Able to make a tight fist b/l   Wrists:FROM w/o swelling or tenderness   Elbows: FROM w/o swelling or tenderness   Shoulders: FROM w/o pain   Hips: FROM w/o pain   Knees: FROM w/o effusions   Ankles: No swelling or tenderness   Feet: No pain with MTP squeeze    Neurological:  CN 2-12 grossly intact. 5/5 strength on extremities.   Psych:  Appropriate affect and mood   Skin:  No rashes.         ASSESSMENT/PLAN:        HCM:   - PCV13 Status:  - PPSV 23 Status:  - COVID-19 vaccine status:   - Annual Influenza vaccine. Status:   - Bone health: not on prednisone   - Plaquenil eye exam:  - Contraception:      F/U in 3-4 months with Dr. Delton See    I personally spent *** minutes face-to-face and non-face-to-face in the care of this patient, which includes all pre, intra, and post visit time on the date of service.

## 2019-12-05 DIAGNOSIS — L409 Psoriasis, unspecified: Principal | ICD-10-CM

## 2019-12-05 NOTE — Unmapped (Signed)
[facility] Refill Coordination Note    Specialty Medication(s) to be Shipped:   Inflammatory Disorders: Cosentyx    Other medication(s) to be shipped: No additional medications requested for fill at this time     Teresa Sloan, DOB: 23-Feb-1956  Phone: [phone] (home)       All above HIPAA information was verified with patient.     Was a Nurse, learning disability used for this call? No    Completed refill call assessment today to schedule patient's medication shipment from the [facility] [phone]).       Specialty medication(s) and dose(s) confirmed: Regimen is correct and unchanged.   Changes to medications: Teresa Sloan reports no changes at this time.  Changes to insurance: No  Questions for the pharmacist: No    Confirmed patient received Welcome Packet with first shipment. The patient will receive a drug information handout for each medication shipped and additional FDA Medication Guides as required.       DISEASE/MEDICATION-SPECIFIC INFORMATION        For patients on injectable medications: Patient currently has 0 doses left.  Next injection is scheduled for 12/14/2019.    SPECIALTY MEDICATION ADHERENCE     Medication Adherence    Patient reported X missed doses in the last month: 0  Specialty Medication: Cosentyx 150 mg/ml  Patient is on additional specialty medications: No  Any gaps in refill history greater than 2 weeks in the last 3 months: no  Demonstrates understanding of importance of adherence: yes  Informant: patient  Reliability of informant: reliable  Adherence tools used: calendar  Confirmed plan for next specialty medication refill: delivery by pharmacy  Refills needed for supportive medications: not needed              Cosentyx 150mg /ml: Patient has 0 days of medication on hand      SHIPPING     Shipping address confirmed in Epic.     Delivery Scheduled: Yes, Expected medication delivery date: 12/11/2019.     Medication will be delivered via Same Day Courier to the prescription address in Epic WAM.    Teresa Sloan   [facility] Specialty Technician

## 2019-12-11 MED FILL — COSENTYX PEN 300 MG/2 PENS (150 MG/ML) SUBCUTANEOUS: 28 days supply | Qty: 2 | Fill #2 | Status: AC

## 2019-12-11 MED FILL — COSENTYX PEN 300 MG/2 PENS (150 MG/ML) SUBCUTANEOUS: SUBCUTANEOUS | 28 days supply | Qty: 2 | Fill #2

## 2019-12-25 NOTE — Unmapped (Signed)
REASON FOR VISIT: F/U joint pain    HISTORY: Teresa Sloan is a 64 y.o. 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 She established with Dr. Delton See 12/07/2018 via telephone since video wouldn't work. At this visit she had joint pain to DIP joints, ankles and lower back after recent discontinuation of MTX therapy. Since this visit was completed by telephone difficult to know if she had active disease and history suggestive of OA. Hand xrays consistent with OA but due to inflammatory markers being elevated MTX restarted at 15mg /week.     Previously she was evaluated by [facility] Rheumatology in 2006 with Dr. Luster Landsberg, who at that time felt she had Synovitis of b/l IP joints and suspected she had underlying Psoriatic Arthritis. He performed intraarticular steroid injections and recommended continuing MTX. She was not seen again at [facility] for further follow up with Rheumatology.     Since last visit she was hospitalized 8/31-9/1 following COVID-19 illness. She received IV fluids and improved without need for supplemental oxygen.     Interim history:  Pt presents for f/u today. She reports her low back has been bothering her. She feels this started shortly after COVID-19 illness. The back pain is the worst when lying in bed at night and with movement during the day. She notices the pain some with sitting but not as bad. She has taken ibuprofen but hasn't noticed any benefit. She has wondered if she needs a new mattress. She denies hip pain or pain radiating to legs. She denies any incontinence or weakness.  She reports AM stiffness lasting 3 hours.     She continues to have pain to her pointer finger which has been present for years. This joint has always seem larger.     She ran out of MTX, unsure when. She is taking Cosentyx monthly per Derm. She held this last month due to COVID-19 but restarted now. Her psoriasis is well controlled, only one small lesion to top of L foot.     She reports she has recovered from COVID-19 without residual symptoms. She denies SOB or cough. She had COVID-19 vaccine a few weeks ago and scheduled to receive second dose October 28th. She plans to receive flu vaccine with PCP next week.       CURRENT MEDICATIONS:  Current Outpatient Medications   Medication Sig Dispense Refill   ??? amLODIPine (NORVASC) 10 MG tablet Take 1 tablet (10 mg total) by mouth daily. 30 tablet 11   ??? ammonium lactate (LAC-HYDRIN) 12 % lotion Apply 1 application topically once daily. 400 g 6   ??? ciclopirox (PENLAC) 8 % solution Apply over nail and surrounding skin. Apply daily over previous coat. After seven (7) days, may remove with alcohol and continue cycle. 6.6 mL 1   ??? clindamycin (CLEOCIN T) 1 % lotion Apply topically Two (2) times a day. To affected areas in armpit, groin, abdomen x 3-4 weeks, less if resolved 120 mL 2   ??? clobetasoL (TEMOVATE) 0.05 % ointment Apply to thick areas of psoriasis twice daily as needed. Avoid face and skin folds 60 g 3   ??? empty container (SHARPS-A-GATOR DISPOSAL SYSTEM) Misc use to dispose of needles 1 each 2   ??? empty container Misc Use as directed to dispose of Cosentyx pens. 1 each 2   ??? folic acid (FOLVITE) 1 MG tablet Take 1 tablet (1 mg total) by mouth daily. 90 tablet 1   ???  hydroCHLOROthiazide (HYDRODIURIL) 25 MG tablet Take 1 tablet (25 mg total) by mouth daily. 30 tablet 11   ??? lisinopriL (PRINIVIL,ZESTRIL) 20 MG tablet Take 1 tablet (20 mg total) by mouth daily. For high blood pressure 90 tablet 3   ??? meloxicam (MOBIC) 15 MG tablet Take 1 tablet (15 mg total) by mouth daily. 14 tablet 0   ??? methocarbamol (ROBAXIN) 750 MG tablet Take 750 mg by mouth Four (4) times a day.     ??? methotrexate 2.5 MG tablet Take 6 tablets (15 mg total) by mouth once a week. (start 4 tablets/week for the first 2 weeks) 72 tablet 1   ??? secukinumab (COSENTYX PEN, 2 PENS,) 150 mg/mL PnIj injection Inject the contents of 2 pens (300 mg) under the skin every 4 weeks. 2 mL 11   ??? secukinumab (COSENTYX) 150 mg/mL PnIj injection inject the contents of 2 pens (300 mg) udner the skin once weekly at weeks 0, 1, 2, 3, and 4. then inject the contents of 2 pens (300 mg) every 4 weeks 14 mL 0   ??? tretinoin (RETIN-A) 0.05 % cream Apply thin layer to underarms a few nights a week. Increase as tolerated. 45 g 5   ??? triamcinolone (KENALOG) 0.1 % ointment Apply to thin areas of psoriasis twice daily as needed 80 g 6   ??? valACYclovir (VALTREX) 500 MG tablet Take 500 mg by mouth daily. Frequency:PHARMDIR   Dosage:500   MG  Instructions:  Note:take one tablet twice a day for three days whenever have outbreak of fever blisters Dose: 500MG        No current facility-administered medications for this visit.       Past Medical History:   Diagnosis Date   ??? Anxiety    ??? Arthritis    ??? Depression    ??? GERD (gastroesophageal reflux disease)    ??? Hypertension    ??? Injury of posterior cruciate ligament    ??? Joint pain    ??? Psoriasis         Record Review: Available records were reviewed, including pertinent office visits, labs, and imaging.      REVIEW OF SYSTEMS: Ten system were reviewed and negative except as noted above.    PHYSICAL EXAM:  VITAL SIGNS:   Vitals:    12/27/19 0929   BP: 137/91   Pulse: 84   Weight: (!) 113.8 kg (250 lb 12.8 oz)   Height: 175.3 cm (5' 9.02)     General:   Pleasant 64 y.o.female in no acute distress, WDWN   Eyes:   PERRL, conjunctiva and sclera not inflamed. Tears appear adequate.    ENT:   No oropharyngeal lesions. Mucous membranes moist.    Lymph:   No masses or cervical lymphadenopathy.    Cardiovascular:  Regular rate and rhythm. No murmur, rub, or gallop. No lower extremity edema.    Lungs:  Clear to auscultation. Normal respiratory effort.    Musculoskeletal:   General: Ambulates w/o assistance   Hands: Able to make a tight fist b/l. Bony enlargement to R 2nd DIP with tenderness. No swelling to b/l hands.   Wrists:FROM w/o swelling or tenderness   Elbows: FROM w/o swelling or tenderness   Shoulders: FROM w/o pain   Hips: FROM w/o pain   Knees: FROM w/o effusions   Ankles: No swelling or tenderness   Feet: No pain with MTP squeeze   No tenderness to spine  Schober~ 15cm  Occiput to wall normal  Neurological:  CN 2-12 grossly intact. 5/5 strength on extremities.   Psych:  Appropriate affect and mood   Skin:  No rashes.         ASSESSMENT/PLAN:  Ms. Quintin is a 64 y.o. 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 She established with Dr. Delton See 12/07/2018 via telephone since video wouldn't work. At this visit she had joint pain to DIP joints, ankles and lower back after recent discontinuation of MTX therapy. Joint pain sounded consistent with OA but due to elevated inflammatory markers MTX was restarted. Today she reports worsening low back pain which sounds mechanical in nature. On exam Schober and occiput to wall are normal. Previous SI joint xray showed DDD. No inflammatory arthritis on exam. Discussed restarting MTX today but since pt reports no benefit in past and major complaint today is her back decided not to restart today, which pt in agreement with.   - Continue off MTX for now  - Advised tylenol 1000mg  up to three times a day for back pain which sounds consistent with DDD  - Advised PT for back. Pt prefers to find someone locally and will notify me of which clinic once identified.   - Update monitoring labs today: CBCw/diff, Crt, AST, ALT, ESR, CRP  - Lumbar spine xray today  - Continue Cosentyx per Dermatology       F/U in 3-4 months with Dr. Delton See    I personally spent 32 minutes face-to-face and non-face-to-face in the care of this patient, which includes all pre, intra, and post visit time on the date of service.

## 2019-12-27 ENCOUNTER — Ambulatory Visit: Admit: 2019-12-27 | Discharge: 2019-12-28 | Payer: BLUE CROSS/BLUE SHIELD | Attending: Family | Primary: Family

## 2019-12-27 ENCOUNTER — Ambulatory Visit: Admit: 2019-12-27 | Discharge: 2019-12-28 | Payer: BLUE CROSS/BLUE SHIELD

## 2019-12-27 DIAGNOSIS — M2550 Pain in unspecified joint: Principal | ICD-10-CM

## 2019-12-27 DIAGNOSIS — I10 Essential (primary) hypertension: Principal | ICD-10-CM

## 2019-12-27 DIAGNOSIS — L409 Psoriasis, unspecified: Principal | ICD-10-CM

## 2019-12-27 DIAGNOSIS — Z79899 Other long term (current) drug therapy: Principal | ICD-10-CM

## 2019-12-27 LAB — CBC W/ AUTO DIFF
BASOPHILS RELATIVE PERCENT: 0.7 %
EOSINOPHILS ABSOLUTE COUNT: 0.3 10*9/L (ref 0.0–0.7)
EOSINOPHILS RELATIVE PERCENT: 4.7 %
HEMATOCRIT: 39.4 % (ref 35.0–44.0)
LYMPHOCYTES ABSOLUTE COUNT: 1.8 10*9/L (ref 0.7–4.0)
LYMPHOCYTES RELATIVE PERCENT: 32 %
MEAN CORPUSCULAR HEMOGLOBIN CONC: 33 g/dL (ref 30.0–36.0)
MEAN CORPUSCULAR HEMOGLOBIN: 28.7 pg (ref 26.0–34.0)
MEAN PLATELET VOLUME: 7.9 fL (ref 7.0–10.0)
MONOCYTES ABSOLUTE COUNT: 0.4 10*9/L (ref 0.1–1.0)
MONOCYTES RELATIVE PERCENT: 6.7 %
NEUTROPHILS ABSOLUTE COUNT: 3.2 10*9/L (ref 1.7–7.7)
NEUTROPHILS RELATIVE PERCENT: 55.9 %
NUCLEATED RED BLOOD CELLS: 0 /100{WBCs} (ref <=4)
PLATELET COUNT: 323 10*9/L (ref 150–450)
RED BLOOD CELL COUNT: 4.53 10*12/L (ref 3.90–5.03)
RED CELL DISTRIBUTION WIDTH: 14.6 % (ref 12.0–15.0)
WBC ADJUSTED: 5.7 10*9/L (ref 3.5–10.5)

## 2019-12-27 LAB — MEAN CORPUSCULAR VOLUME: Erythrocyte mean corpuscular volume:EntVol:Pt:RBC:Qn:Automated count: 86.9

## 2019-12-27 LAB — AST (SGOT): Aspartate aminotransferase:CCnc:Pt:Ser/Plas:Qn:: 15

## 2019-12-27 LAB — ERYTHROCYTE SEDIMENTATION RATE: Lab: 38 — ABNORMAL HIGH

## 2019-12-27 LAB — CREATININE
CREATININE: 0.77 mg/dL
Creatinine:MCnc:Pt:Ser/Plas:Qn:: 0.77
EGFR CKD-EPI NON-AA FEMALE: 82 mL/min/{1.73_m2} (ref >=60)

## 2019-12-27 LAB — C-REACTIVE PROTEIN: C reactive protein:MCnc:Pt:Ser/Plas:Qn:: 16 — ABNORMAL HIGH

## 2019-12-27 LAB — ALT (SGPT): Alanine aminotransferase:CCnc:Pt:Ser/Plas:Qn:: 12

## 2019-12-27 MED ORDER — LISINOPRIL 20 MG TABLET
ORAL_TABLET | Freq: Every day | ORAL | 3 refills | 90.00000 days | Status: CP
Start: 2019-12-27 — End: ?

## 2019-12-27 NOTE — Unmapped (Signed)
We will get labs today. We get an xray of lower spine today.  We will stay off methotrexate for now since you didn't notice much difference on it. I believe your back pain is from degenerative disc disease. Please take tylenol 1000mg  up to three times a day for this. Also please let me know which physical therapy clinic you'd like to go to and I will place this referral.         Patient Education        Learning About Degenerative Disc Disease  What is degenerative disc disease?     Degenerative disc disease isn't really a disease. It's a term used to describe the normal changes in your spinal discs as you age. Spinal discs are small, spongy discs that separate the bones (vertebrae) that make up the spine. The discs act as shock absorbers for the spine. They let your spine flex, bend, and twist.  Degenerative disc disease can take place in one or more places along the spine. It most often occurs in the discs in the lower back and the neck.  The changes in the discs can cause back and neck pain. They can also lead to osteoarthritis, a herniated disc, or spinal stenosis.  What causes it?  As we age, our spinal discs break down, or degenerate. This breakdown causes the symptoms of degenerative disc disease in some people.  When the discs break down, they can lose fluid and dry out, and their outer layers can have tiny cracks or tears. This leads to less padding and less space between the bones in the spine. The body reacts to this by making bony growths on the spine called bone spurs. These spurs can press on the spinal nerve roots or spinal cord. This can cause pain and can affect how well the nerves work.  These changes in the discs are more likely to occur if you smoke, do heavy physical work (such as repeated heavy lifting), or are very overweight. A sudden injury may also cause changes to occur.  What are the symptoms?  Many people with degenerative disc disease have no pain. But others have severe pain or other symptoms that limit their activities. Some of the most common symptoms are:  ?? Pain in the back or neck. Where the pain occurs depends on which discs are affected.  ?? Pain that gets worse when you move, such as when you bend over, reach up, or twist.  ?? Pain that may occur in the rear end (buttocks), arm, or leg if a nerve is pinched.  ?? Numbness or tingling in your arm or leg.  The pain may start after a major injury (such as from a car accident), a minor injury (such as a fall from a low height), or a normal motion (such as bending over to pick something up). It may also start gradually for no known reason and get worse over time.  How is it diagnosed?  A doctor can often diagnose degenerative disc disease while doing a physical exam. If your exam shows no signs of a serious condition, imaging tests (such as an X-ray) aren't likely to help your doctor find the cause of your symptoms.  Sometimes degenerative disc disease is found when an X-ray is taken for another reason, such as an injury or other health problem. But even if the doctor finds degenerative disc disease, that doesn't always mean that you will have symptoms.  How is degenerative disc disease treated?  Self-care  may be all you need to relieve pain caused by disc changes. This may include using ice or heat and taking over-the-counter medicines. Your doctor can prescribe stronger medicines if needed.  If you develop health problems such as osteoarthritis, a herniated disc, or spinal stenosis, you may need other treatments. These include physical therapy and exercises for strengthening and stretching the back. In some cases, surgery may be recommended. It usually involves removing the damaged disc. In some cases, the bone is then permanently joined (fused) to protect the spinal cord. In rare cases, an artificial disc may be used to replace the disc that is removed.  How can you care for yourself?  Here are some things you can do to help manage pain from degenerative disc disease.  ?? Use ice or heat (whichever feels better) on the affected area.  ? Put ice or a cold pack on the area for 10 to 20 minutes at a time. Put a thin cloth between the ice and your skin.  ? Put a warm water bottle, a heating pad set on low, or a warm cloth on your back. Put a thin cloth between the heating pad and your skin. Do not go to sleep with a heating pad on your skin.  ?? Ask your doctor if you can take an over-the-counter pain medicine.   These include acetaminophen (such as Tylenol) and nonsteroidal anti-inflammatory drugs, such as ibuprofen or naproxen. Your doctor can prescribe stronger medicines if needed. Be safe with medicines. Read and follow all instructions on the label.  ?? Get some exercise every day.   Exercise is one of the best ways to help your back feel better and stay better. It's best to start each exercise slowly. You may notice a little soreness, and that's okay. But if an exercise makes your pain worse, stop doing it. Here are things you can try:  ? Walking. It's the simplest and maybe the best activity for your back. It gets your blood moving and helps your muscles stay strong.  ? Exercises that gently stretch and strengthen your stomach, back, and leg muscles. The stronger those muscles are, the better they're able to protect your back.  Follow-up care is a key part of your treatment and safety. Be sure to make and go to all appointments, and call your doctor if you are having problems. It's also a good idea to know your test results and keep a list of the medicines you take.  Where can you learn more?  Go to [facility] at https://myuncchart.org  Select Patient Education under [employer]. Enter [phone] in the search box to learn more about Learning About Degenerative Disc Disease.  Current as of: September 20, 2019??????????????????????????????Content Version: 13.0  ?? 2006-2021 [employer].   Care instructions adapted under license by [facility]. If you have questions about a medical condition or this instruction, always ask your healthcare professional. [employer] Inc[employer] disclaims any warranty or liability for your use of this information.

## 2020-01-02 NOTE — Unmapped (Unsigned)
[facility]  Established Patient Clinic Note    Assessment/Plan:   Teresa Sloan is a 64 y.o.female who presents for the following issues:    Problem List Items Addressed This Visit     None          {Optional Time Based Coding:73335}    Subjective   Teresa Sloan is a 64 y.o. female  coming to clinic today for the following issues:    No chief complaint on file.    HPI:    # HTN  - amlodipine 10 mg daily, lisinopril 20 mg daily, HCTZ 25 mg    *** pap smear, CRC screen    I have reviewed the problem list, medications, and allergies and have updated/reconciled them if needed.    12-point ROS positive as noted in HPI or otherwise negative.    Teresa Sloan  reports that she has never smoked. She has never used smokeless tobacco.  Health Maintenance   Topic Date Due   ??? COVID-19 Vaccine (1) Never done   ??? FOBT/FIT  Never done   ??? Colonoscopy  Never done   ??? Zoster Vaccines (1 of 2) Never done   ??? HPV Cotest with Pap Smear (21-65)  12/08/2018   ??? Pap Smear with Cotest HPV (21-65)  12/08/2018   ??? Influenza Vaccine (1) 11/21/2019   ??? Potassium Monitoring  11/20/2020   ??? Serum Creatinine Monitoring  12/26/2020   ??? Lipid Screening  06/10/2021   ??? Mammogram Start Age 64  09/12/2021   ??? DTaP/Tdap/Td Vaccines (2 - Td or Tdap) 09/19/2025   ??? Hepatitis C Screen  Completed       Objective     VITALS: There were no vitals taken for this visit.    Physical Exam    LABS/IMAGING  I have reviewed pertinent recent labs and imaging in Epic    [facility]  [place] [facility] [place] [facility]  [address], [place], [place] [address] ??? Telephone [phone] ??? Fax [phone]  [email or web address]

## 2020-01-02 NOTE — Unmapped (Signed)
[facility] Shared [facility] Clinical Assessment & Refill Coordination Note    Teresa Sloan, DOB: Aug 05, 1955  Phone: [phone] (home)     All above HIPAA information was verified with patient.     Was a Nurse, learning disability used for this call? No    Specialty Medication(s):   Inflammatory Disorders: Cosentyx     Current Outpatient Medications   Medication Sig Dispense Refill   ??? amLODIPine (NORVASC) 10 MG tablet Take 1 tablet (10 mg total) by mouth daily. 30 tablet 11   ??? ammonium lactate (LAC-HYDRIN) 12 % lotion Apply 1 application topically once daily. 400 g 6   ??? ciclopirox (PENLAC) 8 % solution Apply over nail and surrounding skin. Apply daily over previous coat. After seven (7) days, may remove with alcohol and continue cycle. 6.6 mL 1   ??? clindamycin (CLEOCIN T) 1 % lotion Apply topically Two (2) times a day. To affected areas in armpit, groin, abdomen x 3-4 weeks, less if resolved 120 mL 2   ??? clobetasoL (TEMOVATE) 0.05 % ointment Apply to thick areas of psoriasis twice daily as needed. Avoid face and skin folds 60 g 3   ??? empty container (SHARPS-A-GATOR DISPOSAL SYSTEM) Misc use to dispose of needles 1 each 2   ??? empty container Misc Use as directed to dispose of Cosentyx pens. 1 each 2   ??? folic acid (FOLVITE) 1 MG tablet Take 1 tablet (1 mg total) by mouth daily. 90 tablet 1   ??? hydroCHLOROthiazide (HYDRODIURIL) 25 MG tablet Take 1 tablet (25 mg total) by mouth daily. 30 tablet 11   ??? lisinopriL (PRINIVIL,ZESTRIL) 20 MG tablet Take 1 tablet (20 mg total) by mouth daily. For high blood pressure 90 tablet 3   ??? meloxicam (MOBIC) 15 MG tablet Take 1 tablet (15 mg total) by mouth daily. 14 tablet 0   ??? methocarbamol (ROBAXIN) 750 MG tablet Take 750 mg by mouth Four (4) times a day.     ??? methotrexate 2.5 MG tablet Take 6 tablets (15 mg total) by mouth once a week. (start 4 tablets/week for the first 2 weeks) 72 tablet 1   ??? secukinumab (COSENTYX PEN, 2 PENS,) 150 mg/mL PnIj injection Inject the contents of 2 pens (300 mg) under the skin every 4 weeks. 2 mL 11   ??? secukinumab (COSENTYX) 150 mg/mL PnIj injection inject the contents of 2 pens (300 mg) udner the skin once weekly at weeks 0, 1, 2, 3, and 4. then inject the contents of 2 pens (300 mg) every 4 weeks 14 mL 0   ??? tretinoin (RETIN-A) 0.05 % cream Apply thin layer to underarms a few nights a week. Increase as tolerated. 45 g 5   ??? triamcinolone (KENALOG) 0.1 % ointment Apply to thin areas of psoriasis twice daily as needed 80 g 6   ??? valACYclovir (VALTREX) 500 MG tablet Take 500 mg by mouth daily. Frequency:PHARMDIR   Dosage:500   MG  Instructions:  Note:take one tablet twice a day for three days whenever have outbreak of fever blisters Dose: 500MG        No current facility-administered medications for this visit.        Changes to medications: Brekyn reports no changes at this time.    No Known Allergies    Changes to allergies: No    SPECIALTY MEDICATION ADHERENCE     Cosentyx 150 mg/ml: 10 days of medicine on hand       Medication Adherence  Patient reported X missed doses in the last month: 0  Specialty Medication: Cosentyx 150 mg/ml  Patient is on additional specialty medications: No  Informant: patient  Adherence tools used: calendar  Confirmed plan for next specialty medication refill: delivery by pharmacy  Refills needed for supportive medications: not needed          Specialty medication(s) dose(s) confirmed: Regimen is correct and unchanged.     Are there any concerns with adherence? No    Adherence counseling provided? Not needed    CLINICAL MANAGEMENT AND INTERVENTION      Clinical Benefit Assessment:    Do you feel the medicine is effective or helping your condition? Yes    Clinical Benefit counseling provided? Progress note from 12/27/19 shows evidence of clinical benefit    Adverse Effects Assessment:    Are you experiencing any side effects? No    Are you experiencing difficulty administering your medicine? No    Quality of Life Assessment:    How many days over the past month did your psoriasis  keep you from your normal activities? For example, brushing your teeth or getting up in the morning. 0    Have you discussed this with your provider? Not needed    Therapy Appropriateness:    Is therapy appropriate? Yes, therapy is appropriate and should be continued    DISEASE/MEDICATION-SPECIFIC INFORMATION      For patients on injectable medications: Patient currently has 0 doses left.  Next injection is scheduled for 01/12/20.    PATIENT SPECIFIC NEEDS     - Does the patient have any physical, cognitive, or cultural barriers? No    - Is the patient high risk? No    - Does the patient require a Care Management Plan? No     - Does the patient require physician intervention or other additional services (i.e. nutrition, smoking cessation, social work)? No      SHIPPING     Specialty Medication(s) to be Shipped:   Inflammatory Disorders: Cosentyx    Other medication(s) to be shipped: No additional medications requested for fill at this time     Changes to insurance: No    Delivery Scheduled: Yes, Expected medication delivery date: 01/08/20.     Medication will be delivered via Same Day Courier to the confirmed prescription address in [facility].    The patient will receive a drug information handout for each medication shipped and additional FDA Medication Guides as required.  Verified that patient has previously received a Conservation officer, historic buildings.    All of the patient's questions and concerns have been addressed.    Teresa Sloan   [facility] Shared [facility] Specialty Pharmacist

## 2020-01-08 MED FILL — COSENTYX PEN 300 MG/2 PENS (150 MG/ML) SUBCUTANEOUS: 28 days supply | Qty: 2 | Fill #3 | Status: AC

## 2020-01-08 MED FILL — COSENTYX PEN 300 MG/2 PENS (150 MG/ML) SUBCUTANEOUS: SUBCUTANEOUS | 28 days supply | Qty: 2 | Fill #3

## 2020-01-31 NOTE — Unmapped (Signed)
[facility] Refill Coordination Note    Patient denied all other refills    Specialty Medication(s) to be Shipped:   Inflammatory Disorders: Cosentyx    Other medication(s) to be shipped: No additional medications requested for fill at this time     Teresa Sloan, DOB: November 02, 1955  Phone: [phone] (home)       All above HIPAA information was verified with patient.     Was a Nurse, learning disability used for this call? No    Completed refill call assessment today to schedule patient's medication shipment from the [facility] [phone]).       Specialty medication(s) and dose(s) confirmed: Regimen is correct and unchanged.   Changes to medications: Teresa Sloan reports no changes at this time.  Changes to insurance: No  Questions for the pharmacist: No    Confirmed patient received Welcome Packet with first shipment. The patient will receive a drug information handout for each medication shipped and additional FDA Medication Guides as required.       DISEASE/MEDICATION-SPECIFIC INFORMATION        For patients on injectable medications: Patient currently has 0 doses left.  Next injection is scheduled for 02/09/2020.    SPECIALTY MEDICATION ADHERENCE     Medication Adherence    Patient reported X missed doses in the last month: 0  Specialty Medication: Cosentyx 150 mg/ml  Patient is on additional specialty medications: No  Any gaps in refill history greater than 2 weeks in the last 3 months: no  Demonstrates understanding of importance of adherence: yes  Informant: patient  Reliability of informant: reliable  Adherence tools used: calendar  Confirmed plan for next specialty medication refill: delivery by pharmacy  Refills needed for supportive medications: not needed              Cosentyx 150mg /ml: Patient has 0 days of medication on hand      SHIPPING     Shipping address confirmed in Epic.     Delivery Scheduled: Yes, Expected medication delivery date: 02/04/2020.     Medication will be delivered via Same Day Courier to the prescription address in Epic WAM.    Aristidis Talerico D Ilynn Stauffer   [facility] Shared [facility] Specialty Technician

## 2020-02-04 MED FILL — COSENTYX PEN 300 MG/2 PENS (150 MG/ML) SUBCUTANEOUS: 28 days supply | Qty: 2 | Fill #4 | Status: AC

## 2020-02-04 MED FILL — COSENTYX PEN 300 MG/2 PENS (150 MG/ML) SUBCUTANEOUS: SUBCUTANEOUS | 28 days supply | Qty: 2 | Fill #4

## 2020-02-25 NOTE — Unmapped (Signed)
[facility] Refill Coordination Note    Specialty Medication(s) to be Shipped:   Inflammatory Disorders: Cosentyx    Other medication(s) to be shipped: No additional medications requested for fill at this time     Teresa Sloan, DOB: [birth year]  Phone: [phone] (home)       All above HIPAA information was verified with patient.     Was a Nurse, learning disability used for this call? No    Completed refill call assessment today to schedule patient's medication shipment from the [facility] [phone]).       Specialty medication(s) and dose(s) confirmed: Regimen is correct and unchanged.   Changes to medications: [name] reports no changes at this time.  Changes to insurance: No  Questions for the pharmacist: No    Confirmed patient received Welcome Packet with first shipment. The patient will receive a drug information handout for each medication shipped and additional FDA Medication Guides as required.       DISEASE/MEDICATION-SPECIFIC INFORMATION        For patients on injectable medications: Patient currently has 0 doses left.  Next injection is scheduled for 03/05/20.    SPECIALTY MEDICATION ADHERENCE     Medication Adherence    Patient reported X missed doses in the last month: 0  Specialty Medication: Cosentyx 150 mg/ml  Patient is on additional specialty medications: No  Adherence tools used: calendar        Cosentyx 150 mg/ml: 0 days worth of medication on hand.            SHIPPING     Shipping address confirmed in Epic.     Delivery Scheduled: Yes, Expected medication delivery date: 02/29/20.     Medication will be delivered via UPS to the prescription address in Epic WAM.    [place] A Eriverto Byrnes   [facility] Shared [facility] Specialty Technician

## 2020-02-28 MED FILL — COSENTYX PEN 300 MG/2 PENS (150 MG/ML) SUBCUTANEOUS: 28 days supply | Qty: 2 | Fill #5 | Status: AC

## 2020-02-28 MED FILL — COSENTYX PEN 300 MG/2 PENS (150 MG/ML) SUBCUTANEOUS: SUBCUTANEOUS | 28 days supply | Qty: 2 | Fill #5

## 2020-03-26 NOTE — Unmapped (Signed)
[facility] Refill Coordination Note    Specialty Medication(s) to be Shipped:   Inflammatory Disorders: Cosentyx    Other medication(s) to be shipped: No additional medications requested for fill at this time     Teresa Sloan, DOB: 1956/01/13  Phone: [phone] (home)       All above HIPAA information was verified with patient.     Was a Nurse, learning disability used for this call? No    Completed refill call assessment today to schedule patient's medication shipment from the [facility] [phone]).       Specialty medication(s) and dose(s) confirmed: Regimen is correct and unchanged.   Changes to medications: Teresa Sloan reports no changes at this time.  Changes to insurance: No  Questions for the pharmacist: No    Confirmed patient received Welcome Packet with first shipment. The patient will receive a drug information handout for each medication shipped and additional FDA Medication Guides as required.       DISEASE/MEDICATION-SPECIFIC INFORMATION        For patients on injectable medications: Patient currently has 0 doses left.  Next injection is scheduled for takes on the 14th of every month.    SPECIALTY MEDICATION ADHERENCE     Medication Adherence    Patient reported X missed doses in the last month: 0  Specialty Medication: Cosentyx 150 mg/ml  Patient is on additional specialty medications: No  Any gaps in refill history greater than 2 weeks in the last 3 months: no  Demonstrates understanding of importance of adherence: yes  Informant: patient  Reliability of informant: reliable  Adherence tools used: calendar  Confirmed plan for next specialty medication refill: delivery by pharmacy  Refills needed for supportive medications: not needed        Cosentyx 150 mg/ml: 0 days worth of medication on hand.            SHIPPING     Shipping address confirmed in Epic.     Delivery Scheduled: Yes, Expected medication delivery date: 03/31/2020.     Medication will be delivered via Same Day Courier to the prescription address in Epic WAM.    Teresa Sloan   [facility] Shared [facility] Specialty Technician

## 2020-03-31 MED FILL — COSENTYX PEN 300 MG/2 PENS (150 MG/ML) SUBCUTANEOUS: SUBCUTANEOUS | 28 days supply | Qty: 2 | Fill #6

## 2020-04-21 NOTE — Unmapped (Signed)
[facility] Refill Coordination Note    Specialty Medication(s) to be Shipped:   Inflammatory Disorders: Cosentyx    Other medication(s) to be shipped: No additional medications requested for fill at this time     Teresa Sloan, DOB: 10/26/1955  Phone: [phone] (home)       All above HIPAA information was verified with patient.     Was a Nurse, learning disability used for this call? No    Completed refill call assessment today to schedule patient's medication shipment from the [facility] [phone]).       Specialty medication(s) and dose(s) confirmed: Regimen is correct and unchanged.   Changes to medications: Teresa Sloan reports no changes at this time.  Changes to insurance: No  Questions for the pharmacist: No    Confirmed patient received Welcome Packet with first shipment. The patient will receive a drug information handout for each medication shipped and additional FDA Medication Guides as required.       DISEASE/MEDICATION-SPECIFIC INFORMATION        For patients on injectable medications: Patient currently has 0 doses left.  Next injection is scheduled for takes on the 14th of every month.    SPECIALTY MEDICATION ADHERENCE     Medication Adherence    Patient reported X missed doses in the last month: 0  Specialty Medication: Cosentyx 150 mg/ml  Patient is on additional specialty medications: No  Any gaps in refill history greater than 2 weeks in the last 3 months: no  Demonstrates understanding of importance of adherence: yes  Informant: patient  Reliability of informant: reliable  Adherence tools used: calendar  Confirmed plan for next specialty medication refill: delivery by pharmacy  Refills needed for supportive medications: not needed        Cosentyx 150 mg/ml: 0 days worth of medication on hand.            SHIPPING     Shipping address confirmed in Epic.     Delivery Scheduled: Yes, Expected medication delivery date: 04/28/2020.     Medication will be delivered via Same Day Courier to the prescription address in Epic WAM.    [name]   [facility] Shared [facility] Specialty Technician

## 2020-04-28 MED FILL — COSENTYX PEN 300 MG/2 PENS (150 MG/ML) SUBCUTANEOUS: SUBCUTANEOUS | 28 days supply | Qty: 2 | Fill #7

## 2020-05-19 MED FILL — COSENTYX PEN 300 MG/2 PENS (150 MG/ML) SUBCUTANEOUS: SUBCUTANEOUS | 28 days supply | Qty: 2 | Fill #8

## 2020-05-19 NOTE — Unmapped (Signed)
[facility] Refill Coordination Note    Specialty Medication(s) to be Shipped:   Inflammatory Disorders: Cosentyx    Other medication(s) to be shipped: No additional medications requested for fill at this time     Teresa Sloan, DOB: Jul 30, 1955  Phone: [phone] (home)       All above HIPAA information was verified with patient.     Was a Nurse, learning disability used for this call? No    Completed refill call assessment today to schedule patient's medication shipment from the [facility] [phone]).       Specialty medication(s) and dose(s) confirmed: Regimen is correct and unchanged.   Changes to medications: Zorianna reports no changes at this time.  Changes to insurance: No  Questions for the pharmacist: No    Confirmed patient received Welcome Packet with first shipment. The patient will receive a drug information handout for each medication shipped and additional FDA Medication Guides as required.       DISEASE/MEDICATION-SPECIFIC INFORMATION        For patients on injectable medications: Patient currently has 0 doses left.  Next injection is scheduled for 02/28.    SPECIALTY MEDICATION ADHERENCE     Medication Adherence    Patient reported X missed doses in the last month: 0  Specialty Medication: Cosentyx 150 mg/ml  Patient is on additional specialty medications: No  Patient is on more than two specialty medications: No  Any gaps in refill history greater than 2 weeks in the last 3 months: no  Demonstrates understanding of importance of adherence: yes  Informant: patient  Reliability of informant: reliable  Provider-estimated medication adherence level: good  Patient is at risk for Non-Adherence: No  Adherence tools used: calendar                cosentyx 150 mg/ml: 0 days of medicine on hand         SHIPPING     Shipping address confirmed in Epic.     Delivery Scheduled: Yes, Expected medication delivery date: 02/28.     Medication will be delivered via Same Day Courier to the prescription address in Epic WAM.    Antonietta Barcelona   [facility] Specialty Technician

## 2020-06-16 NOTE — Unmapped (Signed)
Teresa Sloan reports her Cosentyx is working well for her - she has an occasional spot of psoriasis that she treats with topical steroids, but overall, this is stable. She requested refills today of both topicals.     [facility] Shared [facility] Clinical Assessment & Refill Coordination Note    Teresa Sloan, DOB: Jan 01, 1956  Phone: [phone] (home)     All above HIPAA information was verified with patient.     Was a Nurse, learning disability used for this call? No    Specialty Medication(s):   Inflammatory Disorders: Cosentyx     Current Outpatient Medications   Medication Sig Dispense Refill   ??? amLODIPine (NORVASC) 10 MG tablet Take 1 tablet (10 mg total) by mouth daily. 30 tablet 11   ??? ammonium lactate (LAC-HYDRIN) 12 % lotion Apply 1 application topically once daily. 400 g 6   ??? ciclopirox (PENLAC) 8 % solution Apply over nail and surrounding skin. Apply daily over previous coat. After seven (7) days, may remove with alcohol and continue cycle. 6.6 mL 1   ??? clindamycin (CLEOCIN T) 1 % lotion Apply topically Two (2) times a day. To affected areas in armpit, groin, abdomen x 3-4 weeks, less if resolved 120 mL 2   ??? clobetasoL (TEMOVATE) 0.05 % ointment Apply to thick areas of psoriasis twice daily as needed. Avoid face and skin folds 60 g 3   ??? empty container (SHARPS-A-GATOR DISPOSAL SYSTEM) Misc use to dispose of needles 1 each 2   ??? empty container Misc Use as directed to dispose of Cosentyx pens. 1 each 2   ??? folic acid (FOLVITE) 1 MG tablet Take 1 tablet (1 mg total) by mouth daily. 90 tablet 1   ??? hydroCHLOROthiazide (HYDRODIURIL) 25 MG tablet Take 1 tablet (25 mg total) by mouth daily. 30 tablet 11   ??? lisinopriL (PRINIVIL,ZESTRIL) 20 MG tablet Take 1 tablet (20 mg total) by mouth daily. For high blood pressure 90 tablet 3   ??? meloxicam (MOBIC) 15 MG tablet Take 1 tablet (15 mg total) by mouth daily. 14 tablet 0   ??? methocarbamol (ROBAXIN) 750 MG tablet Take 750 mg by mouth Four (4) times a day.     ??? methotrexate 2.5 MG tablet Take 6 tablets (15 mg total) by mouth once a week. (start 4 tablets/week for the first 2 weeks) 72 tablet 1   ??? secukinumab (COSENTYX PEN, 2 PENS,) 150 mg/mL PnIj injection Inject the contents of 2 pens (300 mg) under the skin every 4 weeks. 2 mL 11   ??? secukinumab (COSENTYX) 150 mg/mL PnIj injection inject the contents of 2 pens (300 mg) udner the skin once weekly at weeks 0, 1, 2, 3, and 4. then inject the contents of 2 pens (300 mg) every 4 weeks 14 mL 0   ??? tretinoin (RETIN-A) 0.05 % cream Apply thin layer to underarms a few nights a week. Increase as tolerated. 45 g 5   ??? triamcinolone (KENALOG) 0.1 % ointment Apply to thin areas of psoriasis twice daily as needed 80 g 6   ??? valACYclovir (VALTREX) 500 MG tablet Take 500 mg by mouth daily. Frequency:PHARMDIR   Dosage:500   MG  Instructions:  Note:take one tablet twice a day for three days whenever have outbreak of fever blisters Dose: 500MG        No current facility-administered medications for this visit.        Changes to medications: [name] reports no changes at this time.  No Known Allergies    Changes to allergies: No    SPECIALTY MEDICATION ADHERENCE     Cosentyx 150 mg/ml: 0 LEFT      Medication Adherence    Patient reported X missed doses in the last month: 0  Specialty Medication: Cosentyx  Patient is on additional specialty medications: No  Adherence tools used: calendar          Specialty medication(s) dose(s) confirmed: Regimen is correct and unchanged.     Are there any concerns with adherence? No    Adherence counseling provided? Not needed    CLINICAL MANAGEMENT AND INTERVENTION      Clinical Benefit Assessment:    Do you feel the medicine is effective or helping your condition? Yes    Clinical Benefit counseling provided? Not needed    Adverse Effects Assessment:    Are you experiencing any side effects? No    Are you experiencing difficulty administering your medicine? No    Quality of Life Assessment:    How many days over the past month did your psoriasis  keep you from your normal activities? For example, brushing your teeth or getting up in the morning. 0    Have you discussed this with your provider? Not needed    Acute Infection Status:    Acute infections noted within Epic: No active infections    Patient reported infection: None    Therapy Appropriateness:    Is therapy appropriate? Yes, therapy is appropriate and should be continued    DISEASE/MEDICATION-SPECIFIC INFORMATION      For patients on injectable medications: Patient currently has 0 doses left.  Next injection is scheduled for 4/14 (takes ~4/14 each month).    PATIENT SPECIFIC NEEDS     - Does the patient have any physical, cognitive, or cultural barriers? No    - Is the patient high risk? No    - Does the patient require a Care Management Plan? No     - Does the patient require physician intervention or other additional services (i.e. nutrition, smoking cessation, social work)? No      SHIPPING     Specialty Medication(s) to be Shipped:   Inflammatory Disorders: Cosentyx    Other medication(s) to be shipped: triamcinolone, clobetasol ointment     Changes to insurance: No    Delivery Scheduled: Yes, Expected medication delivery date: 3/31.     Medication will be delivered via Same Day Courier to the confirmed prescription address in [facility]    The patient will receive a drug information handout for each medication shipped and additional FDA Medication Guides as required.  Verified that patient has previously received a Conservation officer, historic buildings.    All of the patient's questions and concerns have been addressed.    [name]   [facility] Shared [facility] Specialty Pharmacist

## 2020-06-17 NOTE — Unmapped (Unsigned)
REASON FOR VISIT: F/U joint pain    HISTORY: Teresa Sloan is a 65 y.o. 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 She established with Dr. Delton See 12/07/2018 via telephone since video wouldn't work. At this visit she had joint pain to DIP joints, ankles and lower back after recent discontinuation of MTX therapy. Since this visit was completed by telephone difficult to know if she had active disease and history suggestive of OA. Hand xrays consistent with OA but due to inflammatory markers being elevated MTX restarted at 15mg /week.     Previously she was evaluated by [facility] Rheumatology in 2006 with Dr. Luster Landsberg, who at that time felt she had Synovitis of b/l IP joints and suspected she had underlying Psoriatic Arthritis. He performed intraarticular steroid injections and recommended continuing MTX. She was not seen again at [facility] for further follow up with Rheumatology.     Last Visit: 12/27/19 with myself. She was on Cosentyx at this visit per Dermatology. She had stopped MTX for unclear reasons. At this visit was reporting back pain which was mechanical in nature so plan to start PT for back. Hold MTX for now.     No showed 05/01/20 visit with Dr. Delton See    Interim history:  Pt presents for f/u today.    Joints?  Swelling?  AM stiffness?  Back?  PT?    Skin?  Derm?  Cosentyx?    Fever?  Illness?  Infections?        CURRENT MEDICATIONS:  Current Outpatient Medications   Medication Sig Dispense Refill   ??? amLODIPine (NORVASC) 10 MG tablet Take 1 tablet (10 mg total) by mouth daily. 30 tablet 11   ??? ammonium lactate (LAC-HYDRIN) 12 % lotion Apply 1 application topically once daily. 400 g 6   ??? ciclopirox (PENLAC) 8 % solution Apply over nail and surrounding skin. Apply daily over previous coat. After seven (7) days, may remove with alcohol and continue cycle. 6.6 mL 1   ??? clindamycin (CLEOCIN T) 1 % lotion Apply topically Two (2) times a day. To affected areas in armpit, groin, abdomen x 3-4 weeks, less if resolved 120 mL 2   ??? clobetasoL (TEMOVATE) 0.05 % ointment Apply to thick areas of psoriasis twice daily as needed. Avoid face and skin folds 60 g 3   ??? empty container (SHARPS-A-GATOR DISPOSAL SYSTEM) Misc use to dispose of needles 1 each 2   ??? empty container Misc Use as directed to dispose of Cosentyx pens. 1 each 2   ??? folic acid (FOLVITE) 1 MG tablet Take 1 tablet (1 mg total) by mouth daily. 90 tablet 1   ??? hydroCHLOROthiazide (HYDRODIURIL) 25 MG tablet Take 1 tablet (25 mg total) by mouth daily. 30 tablet 11   ??? lisinopriL (PRINIVIL,ZESTRIL) 20 MG tablet Take 1 tablet (20 mg total) by mouth daily. For high blood pressure 90 tablet 3   ??? meloxicam (MOBIC) 15 MG tablet Take 1 tablet (15 mg total) by mouth daily. 14 tablet 0   ??? methocarbamol (ROBAXIN) 750 MG tablet Take 750 mg by mouth Four (4) times a day.     ??? methotrexate 2.5 MG tablet Take 6 tablets (15 mg total) by mouth once a week. (start 4 tablets/week for the first 2 weeks) 72 tablet 1   ??? secukinumab (COSENTYX PEN, 2 PENS,) 150 mg/mL PnIj injection Inject the contents of 2 pens (300 mg) under the skin every 4 weeks.  2 mL 11   ??? secukinumab (COSENTYX) 150 mg/mL PnIj injection inject the contents of 2 pens (300 mg) udner the skin once weekly at weeks 0, 1, 2, 3, and 4. then inject the contents of 2 pens (300 mg) every 4 weeks 14 mL 0   ??? tretinoin (RETIN-A) 0.05 % cream Apply thin layer to underarms a few nights a week. Increase as tolerated. 45 g 5   ??? triamcinolone (KENALOG) 0.1 % ointment Apply to thin areas of psoriasis twice daily as needed 80 g 6   ??? valACYclovir (VALTREX) 500 MG tablet Take 500 mg by mouth daily. Frequency:PHARMDIR   Dosage:500   MG  Instructions:  Note:take one tablet twice a day for three days whenever have outbreak of fever blisters Dose: 500MG        No current facility-administered medications for this visit.       Past Medical History:   Diagnosis Date   ??? Anxiety    ??? Arthritis    ??? Depression    ??? GERD (gastroesophageal reflux disease)    ??? Hypertension    ??? Injury of posterior cruciate ligament    ??? Joint pain    ??? Psoriasis         Record Review: Available records were reviewed, including pertinent office visits, labs, and imaging.      REVIEW OF SYSTEMS: Ten system were reviewed and negative except as noted above.    PHYSICAL EXAM:  VITAL SIGNS:   There were no vitals filed for this visit.  General:   Pleasant 65 y.o.female in no acute distress, WDWN   Eyes:   PERRL, conjunctiva and sclera not inflamed. Tears appear adequate.    ENT:   No oropharyngeal lesions. Mucous membranes moist.    Lymph:   No masses or cervical lymphadenopathy.    Cardiovascular:  Regular rate and rhythm. No murmur, rub, or gallop. No lower extremity edema.    Lungs:  Clear to auscultation. Normal respiratory effort.    Musculoskeletal:   General: Ambulates w/o assistance   Hands: Able to make a tight fist b/l. Bony enlargement to R 2nd DIP with tenderness. No swelling to b/l hands.   Wrists:FROM w/o swelling or tenderness   Elbows: FROM w/o swelling or tenderness   Shoulders: FROM w/o pain   Hips: FROM w/o pain   Knees: FROM w/o effusions   Ankles: No swelling or tenderness   Feet: No pain with MTP squeeze   No tenderness to spine  Schober~ 15cm  Occiput to wall normal   Neurological:  CN 2-12 grossly intact. 5/5 strength on extremities.   Psych:  Appropriate affect and mood   Skin:  No rashes.         ASSESSMENT/PLAN:***  Teresa Sloan is a 65 y.o. 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 She established with Dr. Delton See 12/07/2018 via telephone since video wouldn't work. At this visit she had joint pain to DIP joints, ankles and lower back after recent discontinuation of MTX therapy. Joint pain sounded consistent with OA but due to elevated inflammatory markers MTX was restarted. Today she reports worsening low back pain which sounds mechanical in nature. On exam Schober and occiput to wall are normal. Previous SI joint xray showed DDD. No inflammatory arthritis on exam. Discussed restarting MTX today but since pt reports no benefit in past and major complaint today is her back decided not to restart today,  which pt in agreement with.   - Continue off MTX for now  - Advised tylenol 1000mg  up to three times a day for back pain which sounds consistent with DDD  - Advised PT for back. Pt prefers to find someone locally and will notify me of which clinic once identified.   - Update monitoring labs today: CBCw/diff, Crt, AST, ALT, ESR, CRP  - Lumbar spine xray today  - Continue Cosentyx per Dermatology       F/U as scheduled in May with Dr. Delton See    I personally spent *** minutes face-to-face and non-face-to-face in the care of this patient, which includes all pre, intra, and post visit time on the date of service.

## 2020-06-19 ENCOUNTER — Ambulatory Visit: Admit: 2020-06-19 | Payer: BLUE CROSS/BLUE SHIELD | Attending: Family | Primary: Family

## 2020-06-19 MED FILL — TRIAMCINOLONE ACETONIDE 0.1 % TOPICAL OINTMENT: 20 days supply | Qty: 80 | Fill #1

## 2020-06-19 MED FILL — COSENTYX PEN 300 MG/2 PENS (150 MG/ML) SUBCUTANEOUS: SUBCUTANEOUS | 28 days supply | Qty: 2 | Fill #9

## 2020-06-19 MED FILL — CLOBETASOL 0.05 % TOPICAL OINTMENT: 20 days supply | Qty: 60 | Fill #1

## 2020-07-23 NOTE — Unmapped (Signed)
[facility] Refill Coordination Note    Specialty Medication(s) to be Shipped:   Inflammatory Disorders: Cosentyx    Other medication(s) to be shipped: No additional medications requested for fill at this time     Teresa Sloan, DOB: Mar 19, 1956  Phone: [phone] (home)       All above HIPAA information was verified with patient.     Was a Nurse, learning disability used for this call? No    Completed refill call assessment today to schedule patient's medication shipment from the [facility] [phone]).  All relevant notes have been reviewed.     Specialty medication(s) and dose(s) confirmed: Regimen is correct and unchanged.   Changes to medications: Teresa Sloan reports no changes at this time.  Changes to insurance: No  New side effects reported not previously addressed with a pharmacist or physician: None reported  Questions for the pharmacist: No    Confirmed patient received a Conservation officer, historic buildings and a Surveyor, mining with first shipment. The patient will receive a drug information handout for each medication shipped and additional FDA Medication Guides as required.       DISEASE/MEDICATION-SPECIFIC INFORMATION        For patients on injectable medications: Patient currently has 0 doses left.  Next injection is scheduled for 08/02/20.    SPECIALTY MEDICATION ADHERENCE     Medication Adherence    Patient reported X missed doses in the last month: 0  Specialty Medication: Cosentyx 150 mg/ml  Patient is on additional specialty medications: No  Adherence tools used: calendar              Were doses missed due to medication being on hold? No        REFERRAL TO PHARMACIST     Referral to the pharmacist: Not needed      [facility]     Shipping address confirmed in Epic.     Delivery Scheduled: Yes, Expected medication delivery date: 07/29/20.     Medication will be delivered via UPS to the prescription address in Epic WAM.    Teresa Sloan   [facility] Specialty Technician

## 2020-07-28 MED FILL — COSENTYX PEN 300 MG/2 PENS (150 MG/ML) SUBCUTANEOUS: SUBCUTANEOUS | 28 days supply | Qty: 2 | Fill #10

## 2020-08-07 ENCOUNTER — Ambulatory Visit: Admit: 2020-08-07 | Payer: BLUE CROSS/BLUE SHIELD | Attending: Rheumatology | Primary: Rheumatology

## 2020-08-07 NOTE — Unmapped (Unsigned)
RHEUMATOLOGY RETURN VISIT    REASON FOR VISIT: F/U joint pain    HISTORY: Teresa Sloan is a 65 y.o. 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 She established with Dr. Delton See 12/07/2018 via telephone since video wouldn't work. At this visit she had joint pain to DIP joints, ankles and lower back after recent discontinuation of MTX therapy. Since this visit was completed by telephone difficult to know if she had active disease and history suggestive of OA. Hand xrays consistent with OA but due to inflammatory markers being elevated MTX restarted at 15mg /week.     Previously she was evaluated by [facility] Rheumatology in 2006 with Dr. Luster Landsberg, who at that time felt she had Synovitis of b/l IP joints and suspected she had underlying Psoriatic Arthritis. He performed intraarticular steroid injections and recommended continuing MTX. She was not seen again at [facility] for further follow up with Rheumatology.     Since last visit she was hospitalized 8/31-9/1 following COVID-19 illness. She received IV fluids and improved without need for supplemental oxygen.     Interim history:  Pt presents for f/u today. She reports her low back has been bothering her. She feels this started shortly after COVID-19 illness. The back pain is the worst when lying in bed at night and with movement during the day. She notices the pain some with sitting but not as bad. She has taken ibuprofen but hasn't noticed any benefit. She has wondered if she needs a new mattress. She denies hip pain or pain radiating to legs. She denies any incontinence or weakness.  She reports AM stiffness lasting 3 hours.     She continues to have pain to her pointer finger which has been present for years. This joint has always seem larger.     She ran out of MTX, unsure when. She is taking Cosentyx monthly per Derm. She held this last month due to COVID-19 but restarted now. Her psoriasis is well controlled, only one small lesion to top of L foot.     She reports she has recovered from COVID-19 without residual symptoms. She denies SOB or cough. She had COVID-19 vaccine a few weeks ago and scheduled to receive second dose October 28th. She plans to receive flu vaccine with PCP next week.       CURRENT MEDICATIONS:  Current Outpatient Medications   Medication Sig Dispense Refill   ??? amLODIPine (NORVASC) 10 MG tablet Take 1 tablet (10 mg total) by mouth daily. 30 tablet 11   ??? ammonium lactate (LAC-HYDRIN) 12 % lotion Apply 1 application topically once daily. 400 g 6   ??? ciclopirox (PENLAC) 8 % solution Apply over nail and surrounding skin. Apply daily over previous coat. After seven (7) days, may remove with alcohol and continue cycle. 6.6 mL 1   ??? clindamycin (CLEOCIN T) 1 % lotion Apply topically Two (2) times a day. To affected areas in armpit, groin, abdomen x 3-4 weeks, less if resolved 120 mL 2   ??? clobetasoL (TEMOVATE) 0.05 % ointment Apply to thick areas of psoriasis twice daily as needed. Avoid face and skin folds 60 g 3   ??? empty container (SHARPS-A-GATOR DISPOSAL SYSTEM) Misc use to dispose of needles 1 each 2   ??? empty container Misc Use as directed to dispose of Cosentyx pens. 1 each 2   ??? folic acid (FOLVITE) 1 MG tablet Take 1 tablet (1 mg total) by mouth  daily. 90 tablet 1   ??? hydroCHLOROthiazide (HYDRODIURIL) 25 MG tablet Take 1 tablet (25 mg total) by mouth daily. 30 tablet 11   ??? lisinopriL (PRINIVIL,ZESTRIL) 20 MG tablet Take 1 tablet (20 mg total) by mouth daily. For high blood pressure 90 tablet 3   ??? meloxicam (MOBIC) 15 MG tablet Take 1 tablet (15 mg total) by mouth daily. 14 tablet 0   ??? methocarbamol (ROBAXIN) 750 MG tablet Take 750 mg by mouth Four (4) times a day.     ??? methotrexate 2.5 MG tablet Take 6 tablets (15 mg total) by mouth once a week. (start 4 tablets/week for the first 2 weeks) 72 tablet 1   ??? secukinumab (COSENTYX PEN, 2 PENS,) 150 mg/mL PnIj injection Inject the contents of 2 pens (300 mg) under the skin every 4 weeks. 2 mL 11   ??? secukinumab (COSENTYX) 150 mg/mL PnIj injection inject the contents of 2 pens (300 mg) udner the skin once weekly at weeks 0, 1, 2, 3, and 4. then inject the contents of 2 pens (300 mg) every 4 weeks 14 mL 0   ??? tretinoin (RETIN-A) 0.05 % cream Apply thin layer to underarms a few nights a week. Increase as tolerated. 45 g 5   ??? triamcinolone (KENALOG) 0.1 % ointment Apply to thin areas of psoriasis twice daily as needed 80 g 6   ??? valACYclovir (VALTREX) 500 MG tablet Take 500 mg by mouth daily. Frequency:PHARMDIR   Dosage:500   MG  Instructions:  Note:take one tablet twice a day for three days whenever have outbreak of fever blisters Dose: 500MG        No current facility-administered medications for this visit.       Past Medical History:   Diagnosis Date   ??? Anxiety    ??? Arthritis    ??? Depression    ??? GERD (gastroesophageal reflux disease)    ??? Hypertension    ??? Injury of posterior cruciate ligament    ??? Joint pain    ??? Psoriasis         Record Review: Available records were reviewed, including pertinent office visits, labs, and imaging.   Lab Results   Component Value Date    WBC 5.7 12/27/2019    RBC 4.53 12/27/2019    HGB 13.0 12/27/2019    HCT 39.4 12/27/2019    MCV 86.9 12/27/2019    MCH 28.7 12/27/2019    MCHC 33.0 12/27/2019    RDW 14.6 12/27/2019    PLT 323 12/27/2019    MPV 7.9 12/27/2019      Lab Results   Component Value Date    CREATININE 0.77 12/27/2019      Lab Results   Component Value Date    ALKPHOS 80 11/21/2019    BILITOT 0.8 11/21/2019    PROT 6.3 11/21/2019    ALBUMIN 2.6 (L) 11/21/2019    ALT 12 12/27/2019    AST 15 12/27/2019    Foot, hand and SI xrays 2020 were without obvious PsA changes     REVIEW OF SYSTEMS: Ten system were reviewed and negative except as noted above.    PHYSICAL EXAM:  VITAL SIGNS:   There were no vitals filed for this visit.  General:   Pleasant 65 y.o.female in no acute distress, WDWN   Eyes:   PERRL, conjunctiva and sclera not inflamed. Tears appear adequate.    ENT:   No oropharyngeal lesions. Mucous membranes moist.    Lymph:  No masses or cervical lymphadenopathy.    Cardiovascular:  Regular rate and rhythm. No murmur, rub, or gallop. No lower extremity edema.    Lungs:  Clear to auscultation. Normal respiratory effort.    Musculoskeletal:   General: Ambulates w/o assistance   Hands: Able to make a tight fist b/l. Bony enlargement to R 2nd DIP with tenderness. No swelling to b/l hands.   Wrists:FROM w/o swelling or tenderness   Elbows: FROM w/o swelling or tenderness   Shoulders: FROM w/o pain   Hips: FROM w/o pain   Knees: FROM w/o effusions   Ankles: No swelling or tenderness   Feet: No pain with MTP squeeze   No tenderness to spine  Schober~ 15cm  Occiput to wall normal   Neurological:  CN 2-12 grossly intact. 5/5 strength on extremities.   Psych:  Appropriate affect and mood   Skin:  No rashes.         ASSESSMENT/PLAN:  Teresa Sloan is a 65 y.o. 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 She established with Dr. Delton See 12/07/2018 via telephone since video wouldn't work. At this visit she had joint pain to DIP joints, ankles and lower back after recent discontinuation of MTX therapy. Joint pain sounded consistent with OA but due to elevated inflammatory markers MTX was restarted. Today she reports worsening low back pain which sounds mechanical in nature. On exam Schober and occiput to wall are normal. Previous SI joint xray showed DDD. No inflammatory arthritis on exam. Discussed restarting MTX today but since pt reports no benefit in past and major complaint today is her back decided not to restart today, which pt in agreement with.   - Continue off MTX for now  - Advised tylenol 1000mg  up to three times a day for back pain which sounds consistent with DDD  - Advised PT for back. Pt prefers to find someone locally and will notify me of which clinic once identified.   - Update monitoring labs today: CBCw/diff, Crt, AST, ALT, ESR, CRP  - Lumbar spine xray today  - Continue Cosentyx per Dermatology     No follow-ups on file.     Lynann Beaver MD [facility]  [facility] Rheumatology

## 2020-08-22 NOTE — Unmapped (Signed)
[facility] Refill Coordination Note    Specialty Medication(s) to be Shipped:   Inflammatory Disorders: Cosentyx    Other medication(s) to be shipped: No additional medications requested for fill at this time     Teresa Sloan, DOB: 16-Feb-1956  Phone: [phone] (home)       All above HIPAA information was verified with patient.     Was a Nurse, learning disability used for this call? No    Completed refill call assessment today to schedule patient's medication shipment from the [facility] [phone]).  All relevant notes have been reviewed.     Specialty medication(s) and dose(s) confirmed: Regimen is correct and unchanged.   Changes to medications: Laquisha reports no changes at this time.  Changes to insurance: No  New side effects reported not previously addressed with a pharmacist or physician: None reported  Questions for the pharmacist: No    Confirmed patient received a Conservation officer, historic buildings and a Surveyor, mining with first shipment. The patient will receive a drug information handout for each medication shipped and additional FDA Medication Guides as required.       DISEASE/MEDICATION-SPECIFIC INFORMATION        For patients on injectable medications: Patient currently has 0 doses left.  Next injection is scheduled for takes on the 14th of each month.    SPECIALTY MEDICATION ADHERENCE     Medication Adherence    Patient reported X missed doses in the last month: 0  Specialty Medication: Cosentyx 150 mg/ml  Patient is on additional specialty medications: No  Any gaps in refill history greater than 2 weeks in the last 3 months: no  Demonstrates understanding of importance of adherence: yes  Informant: patient  Reliability of informant: reliable  Adherence tools used: calendar  Confirmed plan for next specialty medication refill: delivery by pharmacy  Refills needed for supportive medications: not needed              Were doses missed due to medication being on hold? No        REFERRAL TO PHARMACIST     Referral to the pharmacist: Not needed      [facility]     Shipping address confirmed in Epic.     Delivery Scheduled: Yes, Expected medication delivery date: 08/27/2020.     Medication will be delivered via Same Day Courier to the prescription address in Epic WAM.    Teresa Sloan D Teresa Sloan   [facility] Specialty Technician

## 2020-08-26 DIAGNOSIS — Z1231 Encounter for screening mammogram for malignant neoplasm of breast: Principal | ICD-10-CM

## 2020-08-27 MED FILL — COSENTYX PEN 300 MG/2 PENS (150 MG/ML) SUBCUTANEOUS: SUBCUTANEOUS | 28 days supply | Qty: 2 | Fill #11

## 2020-09-01 ENCOUNTER — Ambulatory Visit: Admit: 2020-09-01 | Discharge: 2020-09-02 | Payer: BLUE CROSS/BLUE SHIELD

## 2020-09-02 DIAGNOSIS — L409 Psoriasis, unspecified: Principal | ICD-10-CM

## 2020-09-02 MED ORDER — COSENTYX PEN 300 MG/2 PENS (150 MG/ML) SUBCUTANEOUS
SUBCUTANEOUS | 0 refills | 84.00000 days | Status: CP
Start: 2020-09-02 — End: ?
  Filled 2020-09-19: qty 6, 84d supply, fill #0

## 2020-09-18 NOTE — Unmapped (Addendum)
[facility] Refill Coordination Note    Specialty Medication(s) to be Shipped:   Inflammatory Disorders: Cosentyx    Other medication(s) to be shipped: No additional medications requested for fill at this time     Teresa Sloan, DOB: 08-26-1955  Phone: [phone] (home)       All above HIPAA information was verified with patient.     Was a Nurse, learning disability used for this call? No    Completed refill call assessment today to schedule patient's medication shipment from the [facility] [phone]).  All relevant notes have been reviewed.     Specialty medication(s) and dose(s) confirmed: Regimen is correct and unchanged.   Changes to medications: Tranice reports no changes at this time.  Changes to insurance: No  New side effects reported not previously addressed with a pharmacist or physician: None reported  Questions for the pharmacist: No    Confirmed patient received a Conservation officer, historic buildings and a Surveyor, mining with first shipment. The patient will receive a drug information handout for each medication shipped and additional FDA Medication Guides as required.       DISEASE/MEDICATION-SPECIFIC INFORMATION        For patients on injectable medications: Patient currently has 0 doses left.  Next injection is scheduled for takes on the 14th of each month.*July, August and September*    SPECIALTY MEDICATION ADHERENCE     Medication Adherence    Patient reported X missed doses in the last month: 0  Specialty Medication: Cosentyx 150 mg/ml  Patient is on additional specialty medications: No  Any gaps in refill history greater than 2 weeks in the last 3 months: no  Demonstrates understanding of importance of adherence: yes  Informant: patient  Reliability of informant: reliable  Adherence tools used: calendar  Confirmed plan for next specialty medication refill: delivery by pharmacy  Refills needed for supportive medications: not needed              Were doses missed due to medication being on hold? No        REFERRAL TO PHARMACIST     Referral to the pharmacist: Not needed      [facility]     Shipping address confirmed in Epic.     Delivery Scheduled: Yes, Expected medication delivery date: 09/19/2020.     Medication will be delivered via Same Day Courier to the prescription address in Epic WAM.    Levie Owensby D Linden Mikes   [facility] Specialty Technician

## 2020-11-07 NOTE — Unmapped (Signed)
PA request received for Cosentyx 150mg /mL, No PA needed at this time.

## 2020-12-01 DIAGNOSIS — L409 Psoriasis, unspecified: Principal | ICD-10-CM

## 2020-12-01 MED ORDER — COSENTYX PEN 300 MG/2 PENS (150 MG/ML) SUBCUTANEOUS
SUBCUTANEOUS | 0 refills | 84.00000 days
Start: 2020-12-01 — End: ?

## 2020-12-01 NOTE — Unmapped (Signed)
Prescription refill request for cosentyx 150mg /ml, Last office visit was 10/15/19  Pt needs appt.    Please Advise

## 2020-12-01 NOTE — Unmapped (Signed)
Patient requested refill of Cosentyx for psoriasis. Has not been seen in clinic for over 1 year, needs appointment

## 2020-12-01 NOTE — Unmapped (Signed)
I left a message for Teresa Sloan to inform that her clinic denied further refills until she is seen in the office.    She has a dose for September (14th), but will need an October dose. She has a new Part D plan, and we will need to get prior authorization/make sure copay is affordable before filling next month.    Teresa Sloan, PharmD, BCPS - Pharmacist   [facility]

## 2020-12-04 ENCOUNTER — Ambulatory Visit: Admit: 2020-12-04 | Discharge: 2020-12-04 | Payer: BLUE CROSS/BLUE SHIELD

## 2020-12-04 ENCOUNTER — Other Ambulatory Visit: Admit: 2020-12-04 | Discharge: 2020-12-04 | Payer: BLUE CROSS/BLUE SHIELD

## 2020-12-04 DIAGNOSIS — L409 Psoriasis, unspecified: Principal | ICD-10-CM

## 2020-12-04 DIAGNOSIS — Z79899 Other long term (current) drug therapy: Principal | ICD-10-CM

## 2020-12-04 MED ORDER — UREA 20 % TOPICAL CREAM
TOPICAL | 6 refills | 0.00000 days | Status: CP
Start: 2020-12-04 — End: ?

## 2020-12-04 MED ORDER — COSENTYX PEN 300 MG/2 PENS (150 MG/ML) SUBCUTANEOUS
SUBCUTANEOUS | 3 refills | 84.00000 days | Status: CP
Start: 2020-12-04 — End: ?
  Filled 2020-12-23: qty 6, 84d supply, fill #0

## 2020-12-04 MED ADMIN — triamcinolone acetonide (KENALOG-40) injection 20 mg: 20 mg | @ 16:00:00 | Stop: 2020-12-04

## 2020-12-04 NOTE — Unmapped (Addendum)
Please have your labs done at [facility]    [address] [facility], [place], [place], [place] [address]      For your psoriasis:  - Continue secukinumab (COSENTYX PEN, 2 PENS,) 150 mg/mL PnIj injection; Inject the contents of 2 pens (300 mg) under the skin every 4 weeks. Sent a refill today.  - Use clobetasol 0.05% ointment twice a day as needed to affected areas  - Use urea 20% cream twice a day to affected areas as needed. This can be used in combination with the clobetasol    Today we did a steroid injection (triamcinolone injection). You can remove the bandaid at any time.     What is triamcinolone injection?  Triamcinolone is a steroid that prevents the release of substances in the body that cause inflammation.  Triamcinolone injection is used to treat many different types of inflammatory conditions, including severe allergic reactions, skin disorders, severe colitis, inflammation of the joints or tendons, blood cell disorders, inflammatory eye disorders, lung disorders, and problems caused by low adrenal gland hormones.  Triamcinolone is also used to treat certain skin disorders caused by autoimmune conditions such as lupus, psoriasis, lichen planus, and others.  How is triamcinolone injection given?  Triamcinolone injection is given through a needle and can be injected into different areas of the body: into a muscle, into the space around a joint or tendon, or into a lesion on the skin. A healthcare provider will give you this injection.  What are the possible side effects of triamcinolone injection?  Get emergency medical help if you have signs of an allergic reaction: hives; difficult breathing; swelling of your face, lips, tongue, or throat.  Call your doctor at once if you have:  blurred vision, tunnel vision, eye pain, or seeing halos around lights;  unusual changes in mood or behavior;    Common side effects of triamcinolone injections into skin lesions may include:  Skin changes (acne, redness, bruising, discoloration of the skin);  Local thinning of the skin and underlying fat (atrophy), this tends to improve with time  increased local hair growth, or thinning hair;  a wound that is slow to heal    This is not a complete list of side effects and others may occur. Call your doctor for medical advice about side effects. You may report side effects to FDA at 1-800-FDA-1088.     For your underarms:  Continue tretinoin (RETIN-A) 0.05 % cream; Apply thin layer to underarms a few nights a week. Increase as tolerated    secukinumab  Pronunciation:  SEK ue KIN ue mab  Brand:  Cosentyx  What is the most important information I should know about secukinumab?  You may get infections more easily. Call your doctor right away if you have signs of infection such as: fever, chills, sweats, muscle aches, skin sores, stomach pain, diarrhea, burning when you urinate, weight loss, cough, shortness of breath, or a cough with red or pink mucus.  What is secukinumab?  Secukinumab is an immunosuppressant that is used to treat moderate to severe plaque psoriasis in adults and children at least 79 years old.  Secukinumab is also used in adults to treat psoriatic arthritis, ankylosing spondylitis, or axial spondyloarthritis.  Secukinumab may also be used for purposes not listed in this medication guide.  What should I discuss with my healthcare provider before using secukinumab?  You should not use secukinumab if you are allergic to it.  Tell your  doctor if you've had or been exposed to tuberculosis, or if you recently traveled. Some infections are more common in certain parts of the world, and you may have been exposed during travel.  Tell your doctor if you have ever had:  an active or chronic infection;  inflammatory bowel disease (Crohn's disease or ulcerative colitis);  an allergy to latex; or  if you currently have signs of infection such as fever, sweats, chills, muscle pain, cough, shortness of breath, cough with bloody mucus, weight loss, skin sores, stomach pain, diarrhea, or painful urination.  Make sure you are current on all vaccines before you start using secukinumab.  Tell your doctor if you are pregnant or breastfeeding.  How should I use secukinumab?  Follow all directions on your prescription label and read all medication guides or instruction sheets. Your doctor may occasionally change your dose. Use the medicine exactly as directed.  Secukinumab is injected under the skin.  Ask your doctor or pharmacist if you don't understand how to use an injection.  Prepare an injection only when you are ready to give it. You may need to use 2 injections to get your total dose.  Secukinumab should be clear or light-yellow. Call your pharmacist if the medicine looks cloudy, has changed colors, or has particles in it.  Your healthcare provider will show you where to inject secukinumab. Do not inject into the same place two times in a row. Avoid injecting into skin that is hard, red, bruised, swollen, tender, or affected by psoriasis. Do not  inject within 2 inches of your navel (belly button).  You may get infections more easily, even serious infections. You will need frequent medical tests.  Do not reuse a needle or syringe. Place them in a puncture-proof sharps container and dispose of it following state or local laws. Keep out of the reach of children and pets.  Store this medicine in the original container in a refrigerator. Protect from light and do not shake or freeze.  Take the injection pen out of the refrigerator and let it reach room temperature for 15 to 30 minutes before using. Give the injection within 1 hour after removing the medicine from a refrigerator.  You may store an injection pen or a prefilled syringe at room temperature (no higher than 86 degrees F) for up to 4 days. Return the pen or syringe to the refrigerator if not used within 4 days. Throw an injection pen or syringe away if left at room temperature for longer than 4 days.  Throw away a prefilled syringe or injection pen after one use, even if there is still medicine left inside.  What happens if I miss a dose?  Call your doctor for instructions if you miss a dose of secukinumab.  What happens if I overdose?  Seek emergency medical attention or call the Poison Help line at [phone])331-0566.  What should I avoid while using secukinumab?  Avoid receiving a live vaccine. Live vaccines include measles, mumps, rubella (MMR), rotavirus, typhoid, yellow fever, varicella (chickenpox), and zoster (shingles).  What are the possible side effects of secukinumab?  Get emergency medical help if you have signs of an allergic reaction: hives; chest tightness, difficult breathing; feeling like you might pass out; swelling of your face, lips, tongue, or throat.  Call your doctor right away if you have signs of infection such as:  redness, warmth, or painful sores on your skin;  cough, shortness of breath, cough with red or pink mucus;  increased urination, burning when you urinate;  sores or white patches in your mouth or throat (yeast infection or thrush);  new or worsening diarrhea or stomach pain; or  fever, chills, sweating, muscle pain, weight loss.  Further doses may be delayed until your infection clears up.   Common side effects may include:  diarrhea; or  cold symptoms such as stuffy nose, sneezing, sore throat.  This is not a complete list of side effects and others may occur. Call your doctor for medical advice about side effects. You may report side effects to FDA at 1-800-FDA-1088.  What other drugs will affect secukinumab?  Other drugs may affect secukinumab, including prescription and over-the-counter medicines, vitamins, and herbal products. Tell your doctor about all other medicines you use.  Where can I get more information?  Your doctor or pharmacist can provide more information about secukinumab.  Remember, keep this and all other medicines out of the reach of children, never share your medicines with others, and use this medication only for the indication prescribed.   Every effort has been made to ensure that the information provided by [employer] ('[employer]') is accurate, up-to-date, and complete, but no guarantee is made to that effect. Drug information contained herein may be time sensitive. [employer] information has been compiled for use by healthcare practitioners and consumers in the [place] and therefore [employer] does not warrant that uses outside of the [place] are appropriate, unless specifically indicated otherwise. [employer]'s drug information does not endorse drugs, diagnose patients or recommend therapy. [employer]'s drug information is an Investment banker, corporate to assist licensed healthcare practitioners in caring for their patients and/or to serve consumers viewing this service as a supplement to, and not a substitute for, the expertise, skill, knowledge and judgment of healthcare practitioners. The absence of a warning for a given drug or drug combination in no way should be construed to indicate that the drug or drug combination is safe, effective or appropriate for any given patient. Mult[employer] does not assume any responsibility for any aspect of healthcare administered with the aid of information [employer] provides. The information contained herein is not intended to cover all possible uses, directions, precautions, warnings, drug interactions, allergic reactions, or adverse effects. If you have questions about the drugs you are taking, check with your doctor, nurse or pharmacist.  Copyright [phone] [employer] Version: 7.01. Revision date: 11/13/2019.  Care instructions adapted under license by [facility]. If you have questions about a medical condition or this instruction, always ask your healthcare professional. [employer] disclaims any warranty or liability for your use of this information.

## 2020-12-04 NOTE — Unmapped (Signed)
Dermatology Note     Assessment and Plan:      (1) Psoriasis with PsA - chronic, stable  ?? Given good control, will continue with Cosentyx.  ?? Rheumatology is managing her Methotrexate for PsA. Patient stopped taking methotrexate 12/2019.  ?? Continue secukinumab (COSENTYX PEN, 2 PENS,) 150 mg/mL PnIj injection; Inject the contents of 2 pens (300 mg) under the skin every 4 weeks. Refilled today.  ?? Continue clobetasol 0.05% ointment nightly  ?? Start urea (CARMOL) 20 % cream; Apply daily as needed to soften psoriasis areas on body - can combine with clobetasol  ?? Discussed kenalog injection to the left elbow, which patient would like to proceed with. See procedure note below:    Risks, benefits and alternatives to kenalog injection were reviewed.  At the end of the discussion, the patient agreed to go forward with the procedure. 0.5 ml of 20 mg/ml kenalog was injected into the left elbow.  Pt tolerated the procedure well.  Post-procedure wound care was reviewed.  ??  (2) High risk medication use (Cosentyx):  ?? Ordered Quantiferon TB Gold Plus today  ??  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 Continue tretinoin (RETIN-A) 0.05 % cream; Apply thin layer to underarms a few nights a week. Increase as tolerated.    The patient was advised to call for an appointment should any new, changing ,or symptomatic lesion develop.     RTC: Return in about 1 year (around 12/04/2021) for follow up of psoriasis. or sooner as needed   _____________________________________________________________________________________    Referring Provider: Referred Self     Chief Complaint     Chief Complaint   Patient presents with   ??? Psoriasis     Pt c/o flares in elbows, hip and and ankles        HPI     Teresa Sloan is a pleasant 65 y.o. female, who presents as a returning patient (last seen by Dr. Regino Schultze and Dr. Heloise Beecham on 10/15/2019) to [facility] for follow up of psoriasis. At last visit, the patient was to continue cosentyx 300 mg every 4 weeks for psoriasis, and start tretinoin 0.05% cream 3x/week and stop differin for granular parakeratosis.    Today, the patient reports the following:    Lesion of Concern  Location: Elbows, scalp, ankles  Duration: --  Character: Flare ups of locations noted above but overall improvement.  Treatment(s): Cosentyx injections the 14th of every month with improvement. She also uses clobetasol nightly and triamcinolone during the day.  Aggravating factor(s): --  Alleviating factor(s): --    The patient denies any other new or changing lesions or areas of concern.     Pertinent Past Medical History     No history of skin cancer   On and off methotrexate for many years, cumulative dose since last fibroscan in 2016 is 4 grams.    Family History:   Negative for melanoma.    Past Medical History, Family History, Social History , Med List, Allergies, Problem List reviewed in the rooming section of Epic     Derm Social History:   does not use sunscreen regularly     ROS: Other than symptoms mentioned in the HPI,   No fevers, chills, or other skin complaints.    Physical Examination     General: Well-appearing female, in no acute distress, resting comfortably  Neuro: Alert and oriented, answers questions appropriately  Focal Skin Exam: Per patient request, Focal examination of bilateral  elbows, bilateral legs, and scalp  Skin examination was notable for the following:  - well-demarcated scaly red plaques on the left elbow, scalp, and right elbow    All areas not commented on are within normal limits.    Scribe's Attestation: Arvilla Market, MD, PhD obtained and performed the history, physical exam and medical decision making elements that were entered into the chart.  Signed by Kelle Darting, Scribe, on December 04, 2020 9:55 AM    ----------------------------------------------------------------------------------------------------------------------  December 04, 2020 12:02 PM. Documentation assistance provided by the Scribe. I was present during the time the encounter was recorded. The information recorded by the Scribe was done at my direction and has been reviewed and validated by me.  ----------------------------------------------------------------------------------------------------------------------

## 2020-12-05 ENCOUNTER — Ambulatory Visit: Admit: 2020-12-05 | Payer: BLUE CROSS/BLUE SHIELD | Attending: Family Medicine | Primary: Family Medicine

## 2020-12-05 LAB — QUANTIFERON TB GOLD PLUS
QUANTIFERON ANTIGEN 1 MINUS NIL: 0 [IU]/mL
QUANTIFERON ANTIGEN 2 MINUS NIL: 0 [IU]/mL
QUANTIFERON MITOGEN: 9.96 [IU]/mL
QUANTIFERON TB GOLD PLUS: NEGATIVE
QUANTIFERON TB NIL VALUE: 0.04 [IU]/mL

## 2020-12-05 LAB — TB MITOGEN: TB MITOGEN VALUE: 10

## 2020-12-05 LAB — TB AG2: TB AG2 VALUE: 0.04

## 2020-12-05 LAB — TB NIL: TB NIL VALUE: 0.04

## 2020-12-05 LAB — TB AG1: TB AG1 VALUE: 0.04

## 2020-12-05 NOTE — Unmapped (Unsigned)
BP Readings from Last 3 Encounters:   06/23/20 153/89   12/27/19 137/91   11/21/19 112/68

## 2020-12-07 NOTE — Unmapped (Signed)
Quant gold negative. OK to proceed with plan as described in note

## 2020-12-11 ENCOUNTER — Ambulatory Visit: Admit: 2020-12-11 | Payer: BLUE CROSS/BLUE SHIELD | Attending: Rheumatology | Primary: Rheumatology

## 2020-12-15 DIAGNOSIS — L409 Psoriasis, unspecified: Principal | ICD-10-CM

## 2020-12-15 MED ORDER — TRIAMCINOLONE ACETONIDE 0.1 % TOPICAL OINTMENT
INTRAMUSCULAR | 6 refills | 0.00000 days | Status: CP
Start: 2020-12-15 — End: ?
  Filled 2020-12-23: qty 80, 20d supply, fill #0

## 2020-12-15 MED ORDER — UREA 20 % TOPICAL CREAM
6 refills | 0.00000 days
Start: 2020-12-15 — End: ?

## 2020-12-15 MED ORDER — CLOBETASOL 0.05 % TOPICAL OINTMENT
OPHTHALMIC | 3 refills | 0.00000 days | Status: CP
Start: 2020-12-15 — End: ?
  Filled 2020-12-23: qty 60, 20d supply, fill #0

## 2020-12-15 NOTE — Unmapped (Signed)
Teresa Sloan's psoriasis continues to be stable/clear on Cosentyx. She had a recent visit and received ILK into a patch on her elbow - this is now clearing.     She requested refills of her topicals since it's easier if we mail these for her.    [facility] Clinical Assessment & Refill Coordination Note    Teresa Sloan, DOB: February 13, 1956  Phone: [phone] (home)     All above HIPAA information was verified with patient.     Was a Nurse, learning disability used for this call? No    Specialty Medication(s):   Inflammatory Disorders: Cosentyx     Current Outpatient Medications   Medication Sig Dispense Refill   ??? amLODIPine (NORVASC) 10 MG tablet Take 1 tablet (10 mg total) by mouth daily. 30 tablet 11   ??? ammonium lactate (LAC-HYDRIN) 12 % lotion Apply 1 application topically once daily. 400 g 6   ??? ciclopirox (PENLAC) 8 % solution Apply over nail and surrounding skin. Apply daily over previous coat. After seven (7) days, may remove with alcohol and continue cycle. 6.6 mL 1   ??? clindamycin (CLEOCIN T) 1 % lotion Apply topically Two (2) times a day. To affected areas in armpit, groin, abdomen x 3-4 weeks, less if resolved 120 mL 2   ??? clobetasoL (TEMOVATE) 0.05 % ointment Apply to thick areas of psoriasis twice daily as needed. Avoid face and skin folds 60 g 3   ??? empty container (SHARPS-A-GATOR DISPOSAL SYSTEM) Misc use to dispose of needles 1 each 2   ??? empty container Misc Use as directed to dispose of Cosentyx pens. 1 each 2   ??? folic acid (FOLVITE) 1 MG tablet Take 1 tablet (1 mg total) by mouth daily. 90 tablet 1   ??? hydroCHLOROthiazide (HYDRODIURIL) 25 MG tablet Take 1 tablet (25 mg total) by mouth daily. 30 tablet 11   ??? lisinopriL (PRINIVIL,ZESTRIL) 20 MG tablet Take 1 tablet (20 mg total) by mouth daily. For high blood pressure 90 tablet 3   ??? meloxicam (MOBIC) 15 MG tablet Take 1 tablet (15 mg total) by mouth daily. 14 tablet 0   ??? methocarbamol (ROBAXIN) 750 MG tablet Take 750 mg by mouth Four (4) times a day.     ??? methotrexate 2.5 MG tablet Take 6 tablets (15 mg total) by mouth once a week. (start 4 tablets/week for the first 2 weeks) 72 tablet 1   ??? secukinumab (COSENTYX PEN, 2 PENS,) 150 mg/mL PnIj injection Inject 2 mL (300 mg total) under the skin every twenty-eight (28) days. 6 mL 3   ??? secukinumab (COSENTYX) 150 mg/mL PnIj injection inject the contents of 2 pens (300 mg) udner the skin once weekly at weeks 0, 1, 2, 3, and 4. then inject the contents of 2 pens (300 mg) every 4 weeks 14 mL 0   ??? tretinoin (RETIN-A) 0.05 % cream Apply thin layer to underarms a few nights a week. Increase as tolerated. 45 g 5   ??? triamcinolone (KENALOG) 0.1 % ointment Apply to thin areas of psoriasis twice daily as needed 80 g 6   ??? urea (CARMOL) 20 % cream Apply daily as needed to soften psoriasis areas on body - can combine with clobetasol 85 g 6   ??? valACYclovir (VALTREX) 500 MG tablet Take 500 mg by mouth daily. Frequency:PHARMDIR   Dosage:500   MG  Instructions:  Note:take one tablet twice a day for three days whenever have outbreak of fever blisters  Dose: 500MG        No current facility-administered medications for this visit.        Changes to medications: Teonna reports no changes at this time.    No Known Allergies    Changes to allergies: No    SPECIALTY MEDICATION ADHERENCE     Cosentyx - 0 left    Medication Adherence    Patient reported X missed doses in the last month: 0  Specialty Medication: Cosentyx  Patient is on additional specialty medications: No  Adherence tools used: calendar          Specialty medication(s) dose(s) confirmed: Regimen is correct and unchanged.     Are there any concerns with adherence? No    Adherence counseling provided? Not needed    CLINICAL MANAGEMENT AND INTERVENTION      Clinical Benefit Assessment:    Do you feel the medicine is effective or helping your condition? Yes    Clinical Benefit counseling provided? Not needed    Adverse Effects Assessment:    Are you experiencing any side effects? No    Are you experiencing difficulty administering your medicine? No    Quality of Life Assessment:    Quality of Life    Rheumatology  Oncology  Dermatology  1. What impact has your specialty medication had on the symptoms of your skin condition (i.e. itchiness, soreness, stinging)?: Tremendous  2. What impact has your specialty medication had on your comfort level with your skin?: Tremendous  Cystic Fibrosis          Have you discussed this with your provider? Not needed    Acute Infection Status:    Acute infections noted within Epic:  No active infections  Patient reported infection: None    Therapy Appropriateness:    Is therapy appropriate and patient progressing towards therapeutic goals? Yes, therapy is appropriate and should be continued    DISEASE/MEDICATION-SPECIFIC INFORMATION      For patients on injectable medications: Patient currently has 0 doses left.  Next injection is scheduled for 10/14 (takes on 14th each month).    PATIENT SPECIFIC NEEDS     - Does the patient have any physical, cognitive, or cultural barriers? No    - Is the patient high risk? No    - Does the patient require a Care Management Plan? No     - Does the patient require physician intervention or other additional services (i.e. nutrition, smoking cessation, social work)? No      SHIPPING     Specialty Medication(s) to be Shipped:   Inflammatory Disorders: Cosentyx    Other medication(s) to be shipped: requested refills: clobetasol ointment, triamcinolone ointment, urea cream     Changes to insurance: No    Delivery Scheduled: Yes, Expected medication delivery date: Tues, 10/4.     Medication will be delivered via Same Day Courier to the confirmed prescription address in [facility].    The patient will receive a drug information handout for each medication shipped and additional FDA Medication Guides as required.  Verified that patient has previously received a Conservation officer, historic buildings and a Surveyor, mining.    The patient or caregiver noted above participated in the development of this care plan and knows that they can request review of or adjustments to the care plan at any time.      All of the patient's questions and concerns have been addressed.    [name]   [facility]  Pharmacy Specialty Pharmacist

## 2020-12-23 DIAGNOSIS — L409 Psoriasis, unspecified: Principal | ICD-10-CM

## 2020-12-24 DIAGNOSIS — L409 Psoriasis, unspecified: Principal | ICD-10-CM

## 2020-12-24 MED ORDER — UREA 20 % TOPICAL CREAM
6 refills | 0 days
Start: 2020-12-24 — End: ?

## 2020-12-25 MED ORDER — UREA 20 % TOPICAL CREAM
TOPICAL | 6 refills | 0.00000 days | Status: CP
Start: 2020-12-25 — End: ?
  Filled 2020-12-25: qty 85, 30d supply, fill #0

## 2020-12-30 NOTE — Unmapped (Signed)
Received fax stating PA for Consentix pen has been approved and is good until 12/24/2021. Approval letter to be scanned into patients chart.

## 2021-01-09 ENCOUNTER — Ambulatory Visit: Admit: 2021-01-09 | Payer: MEDICARE | Attending: Family Medicine | Primary: Family Medicine

## 2021-01-09 NOTE — Unmapped (Unsigned)
[facility]  Established Patient Clinic Note    Assessment/Plan:   [name] is a 65 y.o.female who presents for the following issues:    Problem List Items Addressed This Visit    None         {Optional Time Based Coding:73335}    Subjective   [name] is a 65 y.o. female  coming to clinic today for the following issues:    No chief complaint on file.    HPI:    # HTN  - lisinopril 20 mg, hydrochlorothiazide 25 mg, amlodipine 10 mg  Mobic?  *** BMP    # HCM  *** pap, colon ca, flu, pneumo, zoster, DEXA    I have reviewed the problem list, medications, and allergies and have updated/reconciled them if needed.    12-point ROS positive as noted in HPI or otherwise negative.    [name]  reports that she has never smoked. She has never used smokeless tobacco.  Health Maintenance   Topic Date Due   ??? Zoster Vaccines (1 of 2) Never done   ??? FOBT/FIT  Never done   ??? Colonoscopy  Never done   ??? HPV Cotest with Pap Smear (21-65)  12/08/2018   ??? Pap Smear with Cotest HPV (21-65)  12/08/2018   ??? COVID-19 Vaccine (3 - Moderna risk series) 02/14/2020   ??? Potassium Monitoring  11/20/2020   ??? Influenza Vaccine (1) 11/20/2020   ??? Pneumococcal Vaccine 65+ (1 - PCV) Never done   ??? DEXA Scan-Start Age 59  Never done   ??? Serum Creatinine Monitoring  12/26/2020   ??? Lipid Screening  06/10/2021   ??? Mammogram Start Age 65  09/02/2022   ??? DTaP/Tdap/Td Vaccines (2 - Td or Tdap) 09/19/2025   ??? Hepatitis C Screen  Completed       Objective     VITALS: There were no vitals taken for this visit.    Physical Exam    LABS/IMAGING  I have reviewed pertinent recent labs and imaging in Epic    [facility]  [place] at [facility]  [address], [place], [place] [address] ??? Telephone [phone] ??? Fax [phone]  [email or web address]

## 2021-01-20 MED FILL — UREA 20 % TOPICAL CREAM: 30 days supply | Qty: 85 | Fill #1

## 2021-01-20 MED FILL — TRIAMCINOLONE ACETONIDE 0.1 % TOPICAL OINTMENT: 20 days supply | Qty: 80 | Fill #1

## 2021-01-20 MED FILL — CLOBETASOL 0.05 % TOPICAL OINTMENT: 20 days supply | Qty: 60 | Fill #1

## 2021-03-06 NOTE — Unmapped (Signed)
[facility] Refill Coordination Note    Specialty Medication(s) to be Shipped:   Inflammatory Disorders: Cosentyx    Other medication(s) to be shipped: No additional medications requested for fill at this time     Teresa Sloan, DOB: 1955-11-15  Phone: [phone] (home)       All above HIPAA information was verified with patient.     Was a Nurse, learning disability used for this call? No    Completed refill call assessment today to schedule patient's medication shipment from the [facility] [phone]).  All relevant notes have been reviewed.     Specialty medication(s) and dose(s) confirmed: Regimen is correct and unchanged.   Changes to medications: Maripat reports no changes at this time.  Changes to insurance: No  New side effects reported not previously addressed with a pharmacist or physician: None reported  Questions for the pharmacist: No    Confirmed patient received a Conservation officer, historic buildings and a Surveyor, mining with first shipment. The patient will receive a drug information handout for each medication shipped and additional FDA Medication Guides as required.       DISEASE/MEDICATION-SPECIFIC INFORMATION        For patients on injectable medications: Patient currently has 0 doses left.  Next injection is scheduled for takes on the 14th of each month-January.    SPECIALTY MEDICATION ADHERENCE     Medication Adherence    Patient reported X missed doses in the last month: 0  Specialty Medication: Cosentyx 150 mg/ml  Patient is on additional specialty medications: No  Any gaps in refill history greater than 2 weeks in the last 3 months: no  Demonstrates understanding of importance of adherence: yes  Informant: patient  Reliability of informant: reliable  Adherence tools used: calendar  Confirmed plan for next specialty medication refill: delivery by pharmacy  Refills needed for supportive medications: not needed              Were doses missed due to medication being on hold? No        REFERRAL TO PHARMACIST     Referral to the pharmacist: Not needed      [facility]     Shipping address confirmed in Epic.     Delivery Scheduled: Yes, Expected medication delivery date: 03/12/2021.     Medication will be delivered via Same Day Courier to the prescription address in Epic WAM.    Hadriel Northup D Yuval Nolet   [facility] Specialty Technician

## 2021-03-12 MED FILL — COSENTYX PEN 300 MG/2 PENS (150 MG/ML) SUBCUTANEOUS: SUBCUTANEOUS | 84 days supply | Qty: 6 | Fill #1

## 2021-04-09 DIAGNOSIS — L409 Psoriasis, unspecified: Principal | ICD-10-CM

## 2021-04-09 MED FILL — TRIAMCINOLONE ACETONIDE 0.1 % TOPICAL OINTMENT: 20 days supply | Qty: 80 | Fill #2

## 2021-04-09 MED FILL — UREA 20 % TOPICAL CREAM: 30 days supply | Qty: 85 | Fill #2

## 2021-04-09 NOTE — Unmapped (Signed)
Cosentyx too early to fill.

## 2021-04-10 MED FILL — CLOBETASOL 0.05 % TOPICAL OINTMENT: 20 days supply | Qty: 60 | Fill #2

## 2021-04-21 NOTE — Unmapped (Signed)
Dermatology Note     Assessment and Plan:      (1) Psoriasis with PsA - chronic, stable  ?? Given good control, will continue with Cosentyx.  ?? Rheumatology is managing her Methotrexate for PsA. Patient stopped taking methotrexate 12/2019.  ?? Continue secukinumab (COSENTYX PEN, 2 PENS,) 150 mg/mL PnIj injection; Inject the contents of 2 pens (300 mg) under the skin every 4 weeks. Refilled today.  ?? Increase clobetasol 0.05% ointment twice a day until lesions resolve   ?? Continue urea (CARMOL) 20 % cream; Apply daily as needed to soften psoriasis areas on body - can combine with clobetasol. We discussed using this on her lower extremity lesions where there is more scale and hyperkeratosis.   ?? Discussed kenalog injection to the left elbow, which patient would like to proceed with. See procedure note below:  ?? If she starts developing more lesions and topicals are not working, would consider asking Rheumatology whether a switch to IL-23 or increasing methotrexate is warranted.   ?? Offered injections again but patient declined since she did not see adequate response with previous injections   ??  (2) High risk medication use (Cosentyx):  ?? Quant gold negative on 12/04/20   ??  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 Continue tretinoin (RETIN-A) 0.05 % cream; Apply thin layer to underarms a few nights a week. Increase as tolerated.    The patient was advised to call for an appointment should any new, changing ,or symptomatic lesion develop.     RTC: Return in about 3 months (around 07/20/2021). or sooner as needed   _____________________________________________________________________________________    Referring Provider: Referred Self     Chief Complaint     Chief Complaint   Patient presents with   ??? Lesion Of Concern     On both legs and r elbow, pt states present for a while, still using clobetasol, uea and tretinoin and cosentyx inj       HPI     Teresa Sloan is a pleasant 66 y.o. female, who presents as a returning patient (last seen by Dr. Archie Balboa on 12/04/2020) to [facility] for lesion of concern. At last visit, the patient was continued on Cosentyx and Methotrexate for her psoriasis. Today the patient presents with plaques localized to lower legs and right elbow. States that she intermittently uses clobetasol and the urea cream. Endorses some burning with urea cream.     The patient denies any other new or changing lesions or areas of concern.     Pertinent Past Medical History     No history of skin cancer   On and off methotrexate for many years, cumulative dose since last fibroscan in 2016 is 4 grams.    Family History:   Negative for melanoma.    Past Medical History, Family History, Social History , Med List, Allergies, Problem List reviewed in the rooming section of Epic     Derm Social History:   does not use sunscreen regularly     ROS: Other than symptoms mentioned in the HPI,   No fevers, chills, or other skin complaints.    Physical Examination     General: Well-appearing female, in no acute distress, resting comfortably  Neuro: Alert and oriented, answers questions appropriately  Focal Skin Exam: Per patient request, Focal examination of bilateral elbows, bilateral legs  Skin examination was notable for the following:  - well-demarcated scaly red plaques on the left elbow, bilateral posterior lower extremities  All areas not commented on are within normal limits.

## 2021-04-22 ENCOUNTER — Ambulatory Visit
Admit: 2021-04-22 | Discharge: 2021-04-22 | Payer: MEDICARE | Attending: Student in an Organized Health Care Education/Training Program | Primary: Student in an Organized Health Care Education/Training Program

## 2021-04-22 DIAGNOSIS — Z79899 Other long term (current) drug therapy: Principal | ICD-10-CM

## 2021-04-22 DIAGNOSIS — L409 Psoriasis, unspecified: Principal | ICD-10-CM

## 2021-04-22 MED ORDER — CLOBETASOL 0.05 % TOPICAL OINTMENT
OPHTHALMIC | 3 refills | 0.00000 days | Status: CP
Start: 2021-04-22 — End: ?

## 2021-04-22 NOTE — Unmapped (Signed)
I discussed this patient's case including the history, clinical findings and assessment and plan with the resident physician.  I reviewed the resident???s note.  I agree with the plan as documented in the resident???s note.  I was available.    Lesly Pontarelli, MD

## 2021-05-06 NOTE — Unmapped (Signed)
[facility] Clinical Intervention    Type of intervention: Medication administration    Medication involved: Cosentyx     Problem identified: Patient called in to report a misfire of one of her Cosentyx pens.     Intervention performed: I called [employer] to report the issue (patients can report, but they also need info from pharmacy since product is shipped to pharmacy). They will issue a replacement.     I called Tiamarie to inform her - we'll be back in touch once the product arrives.    In the interim, her insurance will allow another fill on 2/21. We agreed on a same day courier to her prescription address for this day.    I instructed that she could use ONE pen (150 mg, since she received 150 mg on 2/14) when it arrives and then resume normal dosing next month.     Follow-up needed: yes, will contact patient once replacement arrives.     Approximate time spent: >20 minutes    Clinical evidence used to support intervention: Professional judgement    Chemical engineer Shared [facility] Specialty Pharmacist

## 2021-05-12 MED FILL — COSENTYX PEN 300 MG/2 PENS (150 MG/ML) SUBCUTANEOUS: SUBCUTANEOUS | 84 days supply | Qty: 6 | Fill #2

## 2021-06-04 NOTE — Unmapped (Signed)
[facility] Clinical Intervention    Type of intervention: Medication administration    Medication involved: Cosentyx    Problem identified: Spoke with patient yesterday (06/03/21).  Patient had a misfire of one Cosentyx pen in February. ??The misfired pen was replaced by the manufacturer. ??When she received the replacement pens, she was advised that she could inject one pen (150 mg) on that day then resume her normal dosing in March (per Meghan's note on 05/06/21). ??The patient states that she thought the pharmacist told her to inject 2 pens (300 mg) on the day she received the pens and 2 more pens (300 mg) the following week. So she injected 2 pens on 05/12/21 and 2 pens on 05/19/21.     Intervention performed: Sent Epic message to Dr Archie Balboa to ask if patient should skip March dose and resume regular dosing in April or should patient resume normal dosing in March.  Per response from Dr Archie Balboa, patient should resume normal dosing in March.      Upon follow up with patient today, she states that she was wrong about the dosing.  She injected 1 pen on 2/14, 2 pens on 2/21, and her last dose was 2 pens on 3/12.  Advised patient to continue her normal dosing.    Follow-up needed: None needed    Approximate time spent: 5-10 minutes    Clinical evidence used to support intervention: Advice from provider    Hyun Reali Glade Nurse  [facility] Specialty Pharmacist

## 2021-08-05 NOTE — Unmapped (Signed)
 Dermatology Note     Assessment and Plan:      Psoriasis with PsA, BSA 40% - chronic with recent severe exacerbation  No obvious precipitating factor for recent severe flare as patient reports good control of disease 3 months ago  Discussed treatment options including continuing Cosentyx and adding methotrexate back vs switching to [employer] vs switching to [employer] and adding methotrexate. Jointly elect to switch from Cosentyx to [place] and if not improved in a few months, consider adding methotrexate   Continue until you receive Skyrizi: secukinumab (COSENTYX PEN, 2 PENS,) 150 mg/mL PnIj injection; Inject the contents of 2 pens (300 mg) under the skin every 4 weeks. Refilled today.  Continue clobetasol 0.05% ointment twice a day until lesions resolve   Continue triamcinolone 0.1% ointment twice a day until lesions resolve  Continue urea (CARMOL) 20 % cream; Apply daily as needed to soften psoriasis areas on body - can combine with clobetasol. We discussed using this on her lower extremity lesions where there is more scale and hyperkeratosis.   Start Skyrizi 150 mg at weeks 0 and 4, then q12 weeks . Reviewed risks and side effects of biologics  Encouraged patient to follow up with rheumatology for joint pain  If not improved, can consider adding methotrexate at follow up      High risk medication use (on Cosentyx):  Quant gold negative on 12/04/20     The patient was advised to call for an appointment should any new, changing, or symptomatic lesions develop.     RTC: Return in about 10 weeks (around 10/15/2021) for follow up of psoriasis. or sooner as needed   _________________________________________________________________      Chief Complaint     F/u psoriasis    HPI     Teresa Sloan is a 66 y.o. female who presents as a returning patient (last seen by Dr. Mingo Amber and Dr. Linton Rump on 04/22/2021) to Dermatology for follow up of psoriasis. At last visit, patient was to continue cosentyx 300 mg q4 weeks, increase clobetasol 0.05% ointment to BID, and continue urea 20% cream daily for her psoriasis and continue tretinoin 0.05% cream for her granular parakeratosis.     Patient reports a new flare on her entire body, which was previously well-controlled with Cosentyx. She received ILK at [facility] on her elbow lesion and is now flaring. Endorses joint pain, which was previously well-controlled. Denies taking other steroids recently. She has been on Cosentyx for 2-3 years and off methotrexate since she was better on Cosentyx. She has not needed to see rheumatology recently.     The patient denies any other new or changing lesions or areas of concern.     Pertinent Past Medical History     No history of skin cancer  On and off methotrexate for many years, cumulative dose since last fibroscan in 2016 is 4 grams.     Family History:   Negative for melanoma    Past Medical History, Family History, Social History, Medication List, Allergies, and Problem List were reviewed in the rooming section of Epic.     ROS: Other than symptoms mentioned in the HPI, no fevers, chills, or other skin complaints    Physical Examination     GENERAL: Well-appearing female in mildly distressed and tearful relaying current severe joint pain and discomfort with skin flare  NEURO: Alert and oriented, answers questions appropriately  PSYCH: Normal mood and affect  SKIN (Full Skin Exam): Examination of the face, eyelids, lips,  nose, ears, neck, chest, abdomen, back, arms, legs, hands, feet, palms, soles, nails was performed  - Well-demarcated erythematous plaques with silvery scale distributed over the elbows and knees, scalp, forearms, legs, trunk, buttocks    All areas not commented on are within normal limits or unremarkable    Scribe's Attestation: Lottie Dawson, PA obtained and performed the history, physical exam and medical decision making elements that were entered into the chart. Signed by Catalina Antigua, Scribe, on Aug 06, 2021 at 9:41 AM.    ----------------------------------------------------------------------------------------------------------------------  Aug 06, 2021 1:29 PM. Documentation assistance provided by the Scribe. I was present during the time the encounter was recorded. The information recorded by the Scribe was done at my direction and has been reviewed and validated by me.  ----------------------------------------------------------------------------------------------------------------------      (Approved Template 12/03/2019)

## 2021-08-06 ENCOUNTER — Ambulatory Visit: Admit: 2021-08-06 | Discharge: 2021-08-07 | Payer: MEDICARE

## 2021-08-06 DIAGNOSIS — L409 Psoriasis, unspecified: Principal | ICD-10-CM

## 2021-08-06 DIAGNOSIS — Z79899 Other long term (current) drug therapy: Principal | ICD-10-CM

## 2021-08-06 DIAGNOSIS — R234 Changes in skin texture: Principal | ICD-10-CM

## 2021-08-06 MED ORDER — SKYRIZI 150 MG/ML SUBCUTANEOUS PEN INJECTOR
SUBCUTANEOUS | 3 refills | 0.00000 days | Status: CP
Start: 2021-08-06 — End: ?
  Filled 2021-08-13: qty 2, 112d supply, fill #0

## 2021-08-06 MED ORDER — TRIAMCINOLONE ACETONIDE 0.1 % TOPICAL OINTMENT
Freq: Two times a day (BID) | TOPICAL | 1 refills | 0.00000 days | Status: CP
Start: 2021-08-06 — End: 2022-08-06

## 2021-08-06 MED ORDER — CLOBETASOL 0.05 % TOPICAL OINTMENT
Freq: Two times a day (BID) | TOPICAL | 3 refills | 0 days | Status: CP
Start: 2021-08-06 — End: 2022-08-06

## 2021-08-06 MED ORDER — AMMONIUM LACTATE 12 % LOTION
Freq: Every day | TOPICAL | 6 refills | 0.00000 days | Status: CN
Start: 2021-08-06 — End: ?

## 2021-08-06 NOTE — Unmapped (Addendum)
For your psoriasis:   - Continue Cosentyx if Cristy Folks is not approved, continue until you receive [employer] as soon as you get it, and discontinue Cosentyx.   - Inject the Skyrizi 150 mg when you receive it and 4 weeks later, then every 12 weeks.   - Continue clobetasol 0.05% ointment twice daily to affected areas on the body, avoid face and groin  - Continue triamcinolone 0.1% ointment twice daily to milder ares of the body, avoid face and groin   - Some sunlight may be good for your rash.   - You may apply the urea cream to thicker areas before the clobetasol or triamcinolone

## 2021-08-10 NOTE — Unmapped (Signed)
[facility]   Patient Onboarding/Medication Counseling    Teresa Sloan is a 66 y.o. female with psoriasis who I am counseling today on initiation of therapy.  I am speaking to the patient.    Was a Nurse, learning disability used for this call? No    Verified patient's date of birth / HIPAA.    Specialty medication(s) to be sent: Inflammatory Disorders: Skyrizi      Non-specialty medications/supplies to be sent: n/a      Medications not needed at this time: n/a         Skyrizi (risankizumab)    Medication & Administration     Dosage: Plaque psoriasis: Inject 150mg  under the skin at weeks 0 and 4, then every 12 weeks thereafter    Lab tests required prior to treatment initiation:  Tuberculosis: Tuberculosis screening resulted in a non-reactive Quantiferon TB Gold assay.    Administration:       Pen:  Gather all supplies needed for injection on a clean, flat working surface: medication pen removed from packaging, alcohol swab, sharps container, etc.  Look at the medication label - look for correct medication, correct dose, and check the expiration date  Look at the medication through the medication inspection window - the liquid in the pen should appear clear and colorless to slightly yellow, you may see tiny white or clear particles  Lay the pen on a flat surface and allow it to warm up to room temperature for at least 30 minutes  Select injection site - you can use the front of your thigh or your belly (but not the area 2 inches around your belly button); if someone else is giving you the injection you can also use your upper arm in the skin covering your triceps muscle  Prepare injection site - wash your hands and clean the skin at the injection site with an alcohol swab and let it air dry, do not touch the injection site again before the injection  Hold the pen with your fingers on the gray grips so you can see the green activator button (it is on the side of the pen, not the top).  Pinch the skin - with your hand not holding the pen, pinch up a fold of skin at the injection site using your forefinger and thumb  Press the white needle sleeve of the pen down against the pinched skin, then press the green activator button.  You will hear a click, meaning the injection has started.  Note - the pen only activates if the white needle sleeve is pressed down before pressing the green activator button.  Continue holding the pen against your skin for 15 sections, until you hear a second click, or until the yellow indicator has filled the medication inspection window.  The injection is now complete.  Slowly pull the pen straight out from your skin and dispose of the used pen immediately in your sharps disposal container.   If you see any blood at the injection site, press a cotton ball or gauze on the site and maintain pressure until the bleeding stops, do not rub the injection site      Adherence/Missed dose instructions:  If your injection is given more than 7 days after your scheduled injection date - consult your pharmacist for additional instructions on how to adjust your dosing schedule.    Goals of Therapy     Plaque Psoriasis  Minimize areas of skin involvement (% BSA)  Avoidance of long  term glucocorticoid use  Maintenance of effective psychosocial functioning      Side Effects & Monitoring Parameters     Injection site reaction (redness, irritation, inflammation localized to the site of administration)  Signs of a common cold - minor sore throat, runny or stuffy nose, etc.  Felling tired/weak  Headache  Stomach, joint or back pain    The following side effects should be reported to the provider:  Signs of a hypersensitivity reaction - rash; hives; itching; red, swollen, blistered, or peeling skin; wheezing; tightness in the chest or throat; difficulty breathing, swallowing, or talking; swelling of the mouth, face, lips, tongue, or throat; etc.  Reduced immune function - report signs of infection such as fever; chills; body aches; very bad sore throat; ear or sinus pain; cough; more sputum or change in color of sputum; pain with passing urine; wound that will not heal, etc.  Also at a slightly higher risk of some malignancies (mainly skin and blood cancers) due to this reduced immune function.  In the case of signs of infection - the patient should hold the next dose of Skyrizi?? and call your primary care provider to ensure adequate medical care.  Treatment may be resumed when infection is treated and patient is asymptomatic.  Flu-like symptoms  Warm, red, or painful skin or sores on the body  Severe diarrhea or stomach pain      Contraindications, Warnings, & Precautions     Have your bloodwork checked as you have been told by your prescriber  Talk with your doctor if you are pregnant, planning to become pregnant, or breastfeeding  Discuss the possible need for holding your dose(s) of Skyrizi?? when a planned procedure is scheduled with the prescriber as it may delay healing/recovery timeline       Drug/Food Interactions     Medication list reviewed in Epic. The patient was instructed to inform the care team before taking any new medications or supplements. No drug interactions identified.   Talk with you prescriber or pharmacist before receiving any live vaccinations while taking this medication and after you stop taking it    Storage, Handling Precautions, & Disposal     Store this medication in the refrigerator.  Do not freeze   If needed, you may store at room temperature for up to 24 hours  Store in original packaging, protected from light  Do not shake  Dispose of used syringes/pens in a sharps disposal container          Current Medications (including OTC/herbals), Comorbidities and Allergies     Current Outpatient Medications   Medication Sig Dispense Refill    amLODIPine (NORVASC) 10 MG tablet Take 1 tablet (10 mg total) by mouth daily. 30 tablet 11    ammonium lactate (LAC-HYDRIN) 12 % lotion Apply 1 application topically once daily. 400 g 6    ciclopirox (PENLAC) 8 % solution Apply over nail and surrounding skin. Apply daily over previous coat. After seven (7) days, may remove with alcohol and continue cycle. 6.6 mL 1    clindamycin (CLEOCIN T) 1 % lotion Apply topically Two (2) times a day. To affected areas in armpit, groin, abdomen x 3-4 weeks, less if resolved 120 mL 2    clobetasoL (TEMOVATE) 0.05 % ointment Apply to thick areas of psoriasis twice daily as needed. Avoid face and skin folds 60 g 3    clobetasoL (TEMOVATE) 0.05 % ointment Apply topically Two (2) times a day. 60 g 1  empty container (SHARPS-A-GATOR DISPOSAL SYSTEM) Misc use to dispose of needles 1 each 2    empty container Misc Use as directed to dispose of Cosentyx pens. 1 each 2    hydroCHLOROthiazide (HYDRODIURIL) 25 MG tablet Take 1 tablet (25 mg total) by mouth daily. 30 tablet 11    lisinopriL (PRINIVIL,ZESTRIL) 20 MG tablet Take 1 tablet (20 mg total) by mouth daily. For high blood pressure 90 tablet 3    meloxicam (MOBIC) 15 MG tablet Take 1 tablet (15 mg total) by mouth daily. 14 tablet 0    methocarbamol (ROBAXIN) 750 MG tablet Take 1 tablet (750 mg total) by mouth four (4) times a day.      risankizumab-rzaa (SKYRIZI) 150 mg/mL PnIj Inject the contents of 1 pen (150 mg total) under the skin at weeks 0 and 4 for loading dose. 2 mL 0    risankizumab-rzaa (SKYRIZI) 150 mg/mL PnIj Inject the contents of 1 pen (150 mg total) under the skin every 12 weeks. 1 mL 3    secukinumab (COSENTYX PEN, 2 PENS,) 150 mg/mL PnIj injection Inject the contents of 2 pens (300 mg total) under the skin every twenty-eight (28) days. 6 mL 3    secukinumab (COSENTYX) 150 mg/mL PnIj injection inject the contents of 2 pens (300 mg) udner the skin once weekly at weeks 0, 1, 2, 3, and 4. then inject the contents of 2 pens (300 mg) every 4 weeks 14 mL 0    tretinoin (RETIN-A) 0.05 % cream Apply thin layer to underarms a few nights a week. Increase as tolerated. 45 g 5 triamcinolone (KENALOG) 0.1 % ointment Apply to thin areas of psoriasis twice daily as needed 80 g 6    triamcinolone (KENALOG) 0.1 % ointment Apply topically Two (2) times a day. 453.6 g 1    triamcinolone (KENALOG) 0.1 % ointment Apply topically Two (2) times a day. 453.6 g 1    urea (CARMOL) 20 % cream Apply daily as needed to soften psoriasis areas on body - can combine with clobetasol 85 g 6    valACYclovir (VALTREX) 500 MG tablet Take 1 tablet (500 mg total) by mouth daily. Frequency:PHARMDIR   Dosage:500   MG  Instructions:  Note:take one tablet twice a day for three days whenever have outbreak of fever blisters Dose: 500MG        No current facility-administered medications for this visit.       No Known Allergies    Patient Active Problem List   Diagnosis    Pain in joint, lower leg    Psoriasis    Trigger finger    HTN (hypertension)    Class 2 obesity due to excess calories without serious comorbidity with body mass index (BMI) of 36.0 to 36.9 in adult    Arthralgia of both hands    Arthralgia of both ankles    COVID-19    Malaise and fatigue       Reviewed and up to date in Epic.    Appropriateness of Therapy     Acute infections noted within Epic:  No active infections  Patient reported infection: None    Is medication and dose appropriate based on diagnosis and infection status? Yes    Prescription has been clinically reviewed: Yes      Baseline Quality of Life Assessment      How many days over the past month did your psoriasis  keep you from your normal activities? For example, brushing your teeth or  getting up in the morning. A lot    Financial Information     Medication Assistance provided: Prior Authorization    Anticipated copay of $0 reviewed with patient. Verified delivery address.    Delivery Information     Scheduled delivery date: 5/25    Expected start date: 5/25, next 6/22 - we put those dates on her calendar    Medication will be delivered via Same Day Courier to the prescription address in [facility].  This shipment will not require a signature.      Explained the services we provide at [facility] and that each month we would call to set up refills.  Stressed importance of returning phone calls so that we could ensure they receive their medications in time each month.  Informed patient that we should be setting up refills 7-10 days prior to when they will run out of medication.  A pharmacist will reach out to perform a clinical assessment periodically.  Informed patient that a welcome packet, containing information about our pharmacy and other support services, a Notice of Privacy Practices, and a drug information handout will be sent.      The patient or caregiver noted above participated in the development of this care plan and knows that they can request review of or adjustments to the care plan at any time.      Patient or caregiver verbalized understanding of the above information as well as how to contact the pharmacy at [phone] option 4 with any questions/concerns.  The pharmacy is open Monday through Friday 8:30am-4:30pm.  A pharmacist is available 24/7 via pager to answer any clinical questions they may have.    Patient Specific Needs     Does the patient have any physical, cognitive, or cultural barriers? No    Does the patient have adequate living arrangements? (i.e. the ability to store and take their medication appropriately) Yes    Did you identify any home environmental safety or security hazards? No    Patient prefers to have medications discussed with  Patient     Is the patient or caregiver able to read and understand education materials at a high school level or above? Yes    Patient's primary language is  English     Is the patient high risk? No    SOCIAL DETERMINANTS OF HEALTH     At the [facility], we have learned that life circumstances - like trouble affording food, housing, utilities, or transportation can affect the health of many of our patients.   That is why we wanted to ask: are you currently experiencing any life circumstances that are negatively impacting your health and/or quality of life? Patient declined to answer    Social Determinants of Health     Financial Resource Strain: Not on file   Internet Connectivity: Not on file   Food Insecurity: Not on file   Tobacco Use: Low Risk     Smoking Tobacco Use: Never    Smokeless Tobacco Use: Never    Passive Exposure: Not on file   Housing/Utilities: Not on file   Alcohol Use: Not on file   Transportation Needs: Not on file   Substance Use: Not on file   Health Literacy: Not on file   Physical Activity: Not on file   Interpersonal Safety: Not on file   Stress: Not on file   Intimate Partner Violence: Not on file   Depression: Not on file  Social Connections: Not on file       Would you be willing to receive help with any of the needs that you have identified today? Not applicable       Clydell Hakim  [facility] Specialty Pharmacist

## 2021-08-10 NOTE — Unmapped (Signed)
[facility] Specialty Medication Onboarding    Specialty Medication: SKYRIZI 150 mg/mL Pnij (risankizumab-rzaa)  Prior Authorization: Approved   Financial Assistance: No - copay  <$25  Final Copay/Day Supply: $0 / 112 (loading)          $0 / 84 (maintenance)    Insurance Restrictions: None     Notes to Pharmacist: n/a    The triage team has completed the benefits investigation and has determined that the patient is able to fill this medication at [facility]. Please contact the patient to complete the onboarding or follow up with the prescribing physician as needed.

## 2021-08-18 DIAGNOSIS — Z1231 Encounter for screening mammogram for malignant neoplasm of breast: Principal | ICD-10-CM

## 2021-09-15 ENCOUNTER — Ambulatory Visit: Admit: 2021-09-15 | Discharge: 2021-09-16 | Payer: MEDICARE

## 2021-10-04 DIAGNOSIS — L409 Psoriasis, unspecified: Principal | ICD-10-CM

## 2021-10-04 MED ORDER — TRIAMCINOLONE ACETONIDE 0.1 % TOPICAL OINTMENT
0 refills | 0 days
Start: 2021-10-04 — End: ?

## 2021-10-05 NOTE — Unmapped (Signed)
Prescription refill request for Triamcinolone 0.1%, Last office visit was 08/06/21    Please Advise

## 2021-10-06 MED ORDER — TRIAMCINOLONE ACETONIDE 0.1 % TOPICAL OINTMENT
0 refills | 0 days | Status: CP
Start: 2021-10-06 — End: ?

## 2021-10-14 NOTE — Unmapped (Signed)
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 Refilled today  - Continue triamcinolone 0.1% ointment twice a day until lesions resolve. Refilled today  - Continue Skyrizi 150 mg every 12 weeks. Patient's next scheduled injection is September 2023  - Recommended saran wrap for legs after applying topicals   - Encouraged patient to follow up with rheumatology for joint pain, patient says she will call them soon      High risk medication use (on Cosentyx):  - Quant gold negative on 12/04/20   - Will plan to obtain quant gold at next visit in 6 months    DDX erythrasma vs candidiasis of axillae   - Discussed ddx, noted a Wood's lamp exam limited by bright room  - Recommended BPO wash in showers  - START benzoyl peroxide 10% daily to axillae lather 1 minute as tolerated    The patient was advised to call for an appointment should any new, changing, or symptomatic lesions develop.     RTC: Return in about 6 months (around 04/17/2022) for follow up of psoriasis. or sooner as needed   _________________________________________________________________      Chief Complaint     Follow up of psoriasis    HPI     Teresa Sloan is a 66 y.o. female who presents as a returning patient (last seen by Lottie Dawson, PA on 08/06/2021) to Dermatology for follow up of psoriasis. At last visit, patient was to continue cosentyx 300 mg every 4 weeks until switching to skyrizi 150 mg at weeks 0, 4, then every 12 weeks for psoriasis. She additionally was to continue clobetasol 0.05% ointment, triamcinolone 0.1% ointment, and urea 20% cream for the same.    Psoriasis  - Noted that psoriasis has improved since last visit (no longer flaky or painful)  - Improvements have been slow   - Has undergone 2 doses of Skyrizi in May and June, her next dose will be in September   - Notes joint pain in her knees    Pruritus  - She additionally notes intermittent pruritus of her bilateral axillae  - Denies involvement of other areas on body including scalp or groin    The patient denies any other new or changing lesions or areas of concern.     Pertinent Past Medical History     No history of skin cancer  On and off methotrexate for many years, cumulative dose since last fibroscan in 2016 is 4 grams.      Family History:   Negative for melanoma     Past Medical History, Family History, Social History, Medication List, Allergies, and Problem List were reviewed in the rooming section of Epic.     ROS: Other than symptoms mentioned in the HPI, no fevers, chills, or other skin complaints    Physical Examination     GENERAL: Well-appearing female in no acute distress, resting comfortably.  NEURO: Alert and oriented, answers questions appropriately  PSYCH: Normal mood and affect  SKIN: Examination of the face, back, bilateral upper extremities, and bilateral lower extremities was performed  - R > L axilla with moderately well defined hyperpigmented macules coalescing into patches with no significant scale  - Thick hyperpigmented scaling plaques over distal  lower extremities bilaterally with diffuse hyperpigmented patches of resolved psoriasis    All areas not commented on are within normal limits or unremarkable    Scribe's Attestation: Ethel Rana, PA obtained and performed the history, physical exam and medical decision making elements that were entered into the chart.  Signed by Jeryl Columbia, on October 15, 2021 at 9:55 AM.    ----------------------------------------------------------------------------------------------------------------------  October 17, 2021 3:48 PM. Documentation assistance provided by the Scribe. I was present during the time the encounter was recorded. The information recorded by the Scribe was done at my direction and has been reviewed and validated by me.  ----------------------------------------------------------------------------------------------------------------------      (Approved Template 12/03/2019)

## 2021-10-15 ENCOUNTER — Ambulatory Visit: Admit: 2021-10-15 | Discharge: 2021-10-16 | Payer: MEDICARE

## 2021-10-15 DIAGNOSIS — L409 Psoriasis, unspecified: Principal | ICD-10-CM

## 2021-10-15 MED ORDER — CLOBETASOL 0.05 % TOPICAL OINTMENT
3 refills | 0 days | Status: CP
Start: 2021-10-15 — End: ?

## 2021-10-15 MED ORDER — TRIAMCINOLONE ACETONIDE 0.1 % TOPICAL OINTMENT
Freq: Two times a day (BID) | TOPICAL | 0 refills | 0 days | Status: CP
Start: 2021-10-15 — End: ?

## 2021-10-15 NOTE — Unmapped (Addendum)
Continue your topicals: clobetasol, triamcinolone, urea  You can use saran wrap after applying topicals for your legs which might help the cream absorb better    Continue Skyrizi every 12 weeks    For your armpits:  - I recommend obtaining a benzoyl peroxide wash 3-5% (brand name includes Neutragena Clear Pore, CereVe Acne Foaming Cream Cleanser, Differin Cleanser, PanOxyl) that you use in the shower. This can bleach linens (clothes, towels, etc), will NOT bleach your skin or hair). Dry off with a white towel so it doesn't take the color out of clothing and colored towels.

## 2021-10-16 DIAGNOSIS — L081 Erythrasma: Principal | ICD-10-CM

## 2021-10-16 MED ORDER — BENZOYL PEROXIDE 10 % TOPICAL CLEANSER
2 refills | 0 days | Status: CP
Start: 2021-10-16 — End: ?

## 2021-10-16 NOTE — Unmapped (Signed)
Call from patient on nurse line. LVM stating she was ordered an OTC medication to use on her underarms and , per patient, can not afford to buy this.  Requests a prescription for the OTC medication so insurance will pay for it.

## 2021-10-22 NOTE — Unmapped (Signed)
Returned call to patient after she LVM on nurse line requesting the pound jar of ointment for psoriasis.  LVM on patient's voicemail notifying her that a prescription for the big jar of triamcinolone was sent to her pharmacy on 10/15/21 and she should be able to get it from her pharmacy.

## 2021-11-25 NOTE — Unmapped (Signed)
Teresa Sloan reports her psoriasis is greatly improved, with only some residual spots left on her feet/ankles that she treats with clobetasol. She reports her QOL is greatly improved.    [facility] Clinical Assessment & Refill Coordination Note    Teresa Sloan, DOB: 1955-09-11  Phone: [phone] (home)     All above HIPAA information was verified with patient.     Was a Nurse, learning disability used for this call? No    Specialty Medication(s):   Inflammatory Disorders: Skyrizi     Current Outpatient Medications   Medication Sig Dispense Refill    amLODIPine (NORVASC) 10 MG tablet Take 1 tablet (10 mg total) by mouth daily. 30 tablet 11    ammonium lactate (LAC-HYDRIN) 12 % lotion Apply 1 application topically once daily. 400 g 6    benzoyl peroxide 10 % Clsr Wash affected area under arms each day or a few times weekly as tolerated. Lather 1 minute before rinsing 148 mL 2    ciclopirox (PENLAC) 8 % solution Apply over nail and surrounding skin. Apply daily over previous coat. After seven (7) days, may remove with alcohol and continue cycle. 6.6 mL 1    clindamycin (CLEOCIN T) 1 % lotion Apply topically Two (2) times a day. To affected areas in armpit, groin, abdomen x 3-4 weeks, less if resolved 120 mL 2    clobetasoL (TEMOVATE) 0.05 % ointment Apply to thick areas of psoriasis twice daily as needed. Avoid face and skin folds 60 g 3    empty container (SHARPS-A-GATOR DISPOSAL SYSTEM) Misc use to dispose of needles 1 each 2    empty container Misc Use as directed to dispose of Cosentyx pens. 1 each 2    hydroCHLOROthiazide (HYDRODIURIL) 25 MG tablet Take 1 tablet (25 mg total) by mouth daily. 30 tablet 11    lisinopriL (PRINIVIL,ZESTRIL) 20 MG tablet Take 1 tablet (20 mg total) by mouth daily. For high blood pressure 90 tablet 3    methocarbamol (ROBAXIN) 750 MG tablet Take 1 tablet (750 mg total) by mouth four (4) times a day.      risankizumab-rzaa (SKYRIZI) 150 mg/mL PnIj Inject the contents of 1 pen (150 mg total) under the skin at weeks 0 and 4 for loading dose. 2 mL 0    risankizumab-rzaa (SKYRIZI) 150 mg/mL PnIj Inject the contents of 1 pen (150 mg total) under the skin every 12 weeks. 1 mL 3    tretinoin (RETIN-A) 0.05 % cream Apply thin layer to underarms a few nights a week. Increase as tolerated. 45 g 5    triamcinolone (KENALOG) 0.1 % ointment Apply topically Two (2) times a day. 454 g 0    urea (CARMOL) 20 % cream Apply daily as needed to soften psoriasis areas on body - can combine with clobetasol 85 g 6     No current facility-administered medications for this visit.        Changes to medications: Anays reports no changes at this time.    No Known Allergies    Changes to allergies: No    SPECIALTY MEDICATION ADHERENCE     Skyrizi - 0 left  Medication Adherence    Patient reported X missed doses in the last month: 0  Specialty Medication: Skyrizi      Adherence tools used: calendar                      Specialty medication(s) dose(s) confirmed: Regimen is correct and  unchanged.     Are there any concerns with adherence? No    Adherence counseling provided? Not needed    CLINICAL MANAGEMENT AND INTERVENTION      Clinical Benefit Assessment:    Do you feel the medicine is effective or helping your condition? Yes    Clinical Benefit counseling provided? Not needed    Adverse Effects Assessment:    Are you experiencing any side effects? No    Are you experiencing difficulty administering your medicine? No    Quality of Life Assessment:    Quality of Life    Rheumatology  Oncology  Dermatology  1. What impact has your specialty medication had on the symptoms of your skin condition (i.e. itchiness, soreness, stinging)?: Tremendous  2. What impact has your specialty medication had on your comfort level with your skin?: Tremendous  Cystic Fibrosis          Have you discussed this with your provider? Not needed    Acute Infection Status:    Acute infections noted within Epic:  No active infections  Patient reported infection: None    Therapy Appropriateness:    Is therapy appropriate and patient progressing towards therapeutic goals? Yes, therapy is appropriate and should be continued    DISEASE/MEDICATION-SPECIFIC INFORMATION      For patients on injectable medications: Patient currently has 0 doses left.  Next injection is scheduled for 9/14.    PATIENT SPECIFIC NEEDS     Does the patient have any physical, cognitive, or cultural barriers? No    Is the patient high risk? No    Does the patient require a Care Management Plan? No     SOCIAL DETERMINANTS OF HEALTH     At the [facility], we have learned that life circumstances - like trouble affording food, housing, utilities, or transportation can affect the health of many of our patients.   That is why we wanted to ask: are you currently experiencing any life circumstances that are negatively impacting your health and/or quality of life? Patient declined to answer    Social Determinants of Health     Financial Resource Strain: Low Risk  (11/21/2019)    Overall Financial Resource Strain (CARDIA)     Difficulty of Paying Living Expenses: Not very hard   Internet Connectivity: Not on file   Food Insecurity: No Food Insecurity (11/21/2019)    Hunger Vital Sign     Worried About Running Out of Food in the Last Year: Never true     Ran Out of Food in the Last Year: Never true   Tobacco Use: Low Risk  (10/17/2021)    Patient History     Smoking Tobacco Use: Never     Smokeless Tobacco Use: Never     Passive Exposure: Not on file   Housing/Utilities: Low Risk  (11/21/2019)    Housing/Utilities     Within the past 12 months, have you ever stayed: outside, in a car, in a tent, in an overnight shelter, or temporarily in someone else's home (i.e. couch-surfing)?: No     Are you worried about losing your housing?: No     Within the past 12 months, have you been unable to get utilities (heat, electricity) when it was really needed?: No   Alcohol Use: Not on file   Transportation Needs: No Transportation Needs (11/21/2019)    PRAPARE - Therapist, art (Medical): No     Lack of Transportation (Non-Medical):  No   Substance Use: Not on file   Health Literacy: Not on file   Physical Activity: Not on file   Interpersonal Safety: Not on file   Stress: Not on file   Intimate Partner Violence: Not on file   Depression: Not at risk (11/01/2019)    PHQ-2     PHQ-2 Score: 0   Social Connections: Not on file       Would you be willing to receive help with any of the needs that you have identified today? Not applicable       SHIPPING     Specialty Medication(s) to be Shipped:   Inflammatory Disorders: Skyrizi    Other medication(s) to be shipped: No additional medications requested for fill at this time     Changes to insurance: No    Delivery Scheduled: Yes, Expected medication delivery date: 9/8.     Medication will be delivered via Same Day Courier to the confirmed prescription address in [facility].    The patient will receive a drug information handout for each medication shipped and additional FDA Medication Guides as required.  Verified that patient has previously received a Conservation officer, historic buildings and a Surveyor, mining.    The patient or caregiver noted above participated in the development of this care plan and knows that they can request review of or adjustments to the care plan at any time.      All of the patient's questions and concerns have been addressed.    [name]   [facility] Specialty Pharmacist

## 2021-11-27 MED FILL — SKYRIZI 150 MG/ML SUBCUTANEOUS PEN INJECTOR: 84 days supply | Qty: 1 | Fill #0

## 2021-12-07 DIAGNOSIS — L409 Psoriasis, unspecified: Principal | ICD-10-CM

## 2021-12-07 MED ORDER — TRIAMCINOLONE ACETONIDE 0.1 % TOPICAL OINTMENT
Freq: Two times a day (BID) | TOPICAL | 1 refills | 0.00000 days
Start: 2021-12-07 — End: ?

## 2021-12-07 NOTE — Unmapped (Signed)
Patient called for refill for the triamcinolone pound jar. Pharmacy told her no refills.    Pended Rx.

## 2021-12-08 MED ORDER — TRIAMCINOLONE ACETONIDE 0.1 % TOPICAL OINTMENT
Freq: Two times a day (BID) | TOPICAL | 1 refills | 0.00000 days | Status: CP
Start: 2021-12-08 — End: ?

## 2022-02-16 NOTE — Unmapped (Signed)
[facility] Refill Coordination Note    Specialty Medication(s) to be Shipped:   Inflammatory Disorders: Skyrizi    Other medication(s) to be shipped: No additional medications requested for fill at this time     Teresa Sloan, DOB: 11-01-1955  Phone: [phone] (home)       All above HIPAA information was verified with patient.     Was a Nurse, learning disability used for this call? No    Completed refill call assessment today to schedule patient's medication shipment from the [facility] [phone]).  All relevant notes have been reviewed.     Specialty medication(s) and dose(s) confirmed: Regimen is correct and unchanged.   Changes to medications: Jelisa reports no changes at this time.  Changes to insurance: No  New side effects reported not previously addressed with a pharmacist or physician: None reported  Questions for the pharmacist: No    Confirmed patient received a Conservation officer, historic buildings and a Surveyor, mining with first shipment. The patient will receive a drug information handout for each medication shipped and additional FDA Medication Guides as required.       DISEASE/MEDICATION-SPECIFIC INFORMATION        For patients on injectable medications: Patient currently has 0 doses left.  Next injection is scheduled for 02/25/22.    SPECIALTY MEDICATION ADHERENCE     Medication Adherence    Specialty Medication: skyrizi 150mg /ml  Patient is on additional specialty medications: No  Patient is on more than two specialty medications: No      Adherence tools used: calendar                          Were doses missed due to medication being on hold? No    SKYRIZI 150 mg/mL Pnij (risankizumab-rzaa)  : 0 days of medicine on hand       REFERRAL TO PHARMACIST     Referral to the pharmacist: Not needed      [facility]     Shipping address confirmed in Epic.     Delivery Scheduled: Yes, Expected medication delivery date: 02/19/22.     Medication will be delivered via UPS to the prescription address in Epic WAM.    Helios Kohlmann[facility] Specialty Technician

## 2022-02-19 MED FILL — SKYRIZI 150 MG/ML SUBCUTANEOUS PEN INJECTOR: 84 days supply | Qty: 1 | Fill #1

## 2022-03-17 NOTE — Unmapped (Signed)
PA for Skyrizi 150 mg/mL pen initiated via CMM. [employer] [place] - PA Case ID: [id number]

## 2022-03-19 NOTE — Unmapped (Signed)
Fax received stating PA was denied for Skyrizi 150 mg/ml , scanned into media tab for review

## 2022-03-24 NOTE — Unmapped (Signed)
Appeal letter and copy of denial letter faxed to [facility] appeals at [phone] with confirmation receipt received.

## 2022-04-08 NOTE — Unmapped (Signed)
Error- Cristy Folks is too early to fill.

## 2022-04-08 NOTE — Unmapped (Deleted)
[facility] Refill Coordination Note    Specialty Medication(s) to be Shipped:   Inflammatory Disorders: Skyrizi    Other medication(s) to be shipped: No additional medications requested for fill at this time     Teresa Sloan, DOB: Jul 24, 1955  Phone: [phone] (home)       All above HIPAA information was verified with patient.     Was a Nurse, learning disability used for this call? No    Completed refill call assessment today to schedule patient's medication shipment from the [facility] [phone]).  All relevant notes have been reviewed.     Specialty medication(s) and dose(s) confirmed: Regimen is correct and unchanged.   Changes to medications: Alaija reports no changes at this time.  Changes to insurance: No  New side effects reported not previously addressed with a pharmacist or physician: {sscrefillsideeffects:78475}  Questions for the pharmacist: {Blank:19197::Yes: ***,No}    Confirmed patient received a Conservation officer, historic buildings and a Surveyor, mining with first shipment. The patient will receive a drug information handout for each medication shipped and additional FDA Medication Guides as required.       DISEASE/MEDICATION-SPECIFIC INFORMATION        {clinicspecificinstructions:59274}    SPECIALTY MEDICATION ADHERENCE     Medication Adherence    Patient reported X missed doses in the last month: 0  Specialty Medication: SKYRIZI 150 mg/mL  Patient is on additional specialty medications: No      Adherence tools used: calendar                          Were doses missed due to medication being on hold? {Blank:19197::No,Yes - ***}    *** *** {Blank:19197::mg,mg/ml,***}: *** days of medicine on hand   *** *** {Blank:19197::mg,mg/ml,***}: *** days of medicine on hand   *** *** {Blank:19197::mg,mg/ml,***}: *** days of medicine on hand   *** *** {Blank:19197::mg,mg/ml,***}: *** days of medicine on hand   *** *** {Blank:19197::mg,mg/ml,***}: *** days of medicine on hand     REFERRAL TO PHARMACIST     Referral to the pharmacist: {SSCRefertoRPH:77899}      SHIPPING     Shipping address confirmed in Epic.     Delivery Scheduled: {Blank:19197::Yes, Expected medication delivery date: ***.,Yes, Expected medication delivery date: ***.  However, Rx request for refills was sent to the provider as there are none remaining.,Patient declined refill at this time due to ***.,No, cannot schedule delivery at this time as there are outstanding items that need addressed.  This note has been handed off to the provider for follow up.,Due to patient insurance changes, unable to fill at [facility], please route Rx to *** specialty pharmacy}     Medication will be delivered via {Blank:19197::UPS,Next Day Courier,Same Day Courier,Clinic Courier - *** clinic,***} to the {Blank:19197::prescription,temporary} address in Epic WAM.    Unk Lightning   [facility] Specialty {Blank:19197::Pharmacist,Technician}

## 2022-04-12 DIAGNOSIS — L409 Psoriasis, unspecified: Principal | ICD-10-CM

## 2022-04-12 MED ORDER — TRIAMCINOLONE ACETONIDE 0.1 % TOPICAL OINTMENT
Freq: Two times a day (BID) | TOPICAL | 1 refills | 0.00000 days | Status: CP
Start: 2022-04-12 — End: ?

## 2022-04-12 NOTE — Unmapped (Signed)
Received faxed request for Triamcinolone 0.1%, Last OV 10/15/2021. Medication pended please refill if appropriate

## 2022-04-30 NOTE — Unmapped (Signed)
This encounter was created in error - please disregard.

## 2022-05-10 ENCOUNTER — Ambulatory Visit: Admit: 2022-05-10 | Discharge: 2022-05-10 | Payer: MEDICARE

## 2022-05-10 DIAGNOSIS — L409 Psoriasis, unspecified: Principal | ICD-10-CM

## 2022-05-10 DIAGNOSIS — Z79899 Other long term (current) drug therapy: Principal | ICD-10-CM

## 2022-05-10 DIAGNOSIS — L304 Erythema intertrigo: Principal | ICD-10-CM

## 2022-05-10 MED ORDER — KETOCONAZOLE 2 % SHAMPOO
TOPICAL | 3 refills | 0.00000 days | Status: CP
Start: 2022-05-10 — End: 2022-06-09

## 2022-05-10 MED ORDER — CLINDAMYCIN 1 % LOTION
Freq: Two times a day (BID) | TOPICAL | 2 refills | 0.00000 days | Status: CP
Start: 2022-05-10 — End: ?

## 2022-05-10 MED ORDER — CLOBETASOL 0.05 % TOPICAL OINTMENT
Freq: Two times a day (BID) | TOPICAL | 1 refills | 0 days | Status: CP
Start: 2022-05-10 — End: 2023-05-10
  Filled 2022-05-18: qty 60, 20d supply, fill #0

## 2022-05-10 MED ORDER — SKYRIZI 150 MG/ML SUBCUTANEOUS PEN INJECTOR
SUBCUTANEOUS | 3 refills | 0.00000 days | Status: CP
Start: 2022-05-10 — End: ?
  Filled 2022-05-18: qty 1, 84d supply, fill #0

## 2022-05-10 MED ORDER — TRIAMCINOLONE ACETONIDE 0.1 % TOPICAL OINTMENT
INTRAMUSCULAR | 6 refills | 0.00000 days | Status: CP
Start: 2022-05-10 — End: ?
  Filled 2022-05-18: qty 80, 20d supply, fill #0

## 2022-05-10 NOTE — Unmapped (Signed)
[facility] releases most results to you as soon as they are available. Therefore, you may see some results before we do. Please give us 3 business days to review the tests and contact you by phone or through MyChart. If you are concerned that some results may be upsetting or confusing, you may wish to wait until we contact you before looking at the report in MyChart.   If you have an urgent question, you can send us a message or call our clinic. Otherwise, we prefer that you wait 3 business days for us to contact you.    [facility] Clinical Team

## 2022-05-10 NOTE — Unmapped (Signed)
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 May have component of intertrigo vs erythrasma   - Will plan to trial ketoconazole 2% cream and clindamycin 1% lotion daily for a month. If no improvement, will plan to biopsy at next visit. Has never had diagnostic psoriasis biopsy before  - Continue triamcinolone 0.1% ointment twice a day until lesions resolve for legs, advised to hold off on intertriginous areas. Refilled today  - Continue Skyrizi 150 mg every 12 weeks. Patient's next scheduled injection is March 2024    High risk medication use (on [place]):  - Quant gold negative on 12/04/20   - Repeat quant gold ordered today      The patient was advised to call for an appointment should any new, changing, or symptomatic lesions develop.     RTC: Return in about 1 month (around 06/08/2022) for Recheck. or sooner as needed   _________________________________________________________________      Chief Complaint     Chief Complaint   Patient presents with    Psoriasis     Pt states that she's been dealing with real bad breakouts especially in the groin area as well as other areas of concerns. Pt states there has been no improvement with any of the medication provided. It's been itchy and some spots tend to smell bad         HPI     Teresa Sloan is a 67 y.o. female who presents as a returning patient (last seen 10/15/2021) to Dermatology for follow up of psoriasis. At last visit, patient was continued on skyrizi 150 mg every 12 weeks.    Today, patient reports the following:  - Having significant itching of lower legs, inguinal folds, and R axilla  - Next skyrizi injection next month, tolerating well  - Has not seen rheumatology    The patient denies any other new or changing lesions or areas of concern.     Pertinent Past Medical History     No history of skin cancer  On and off methotrexate for many years, cumulative dose since last fibroscan in 2016 is 4 grams.      Family History:   Negative for melanoma    Past Medical History, Family History, Social History, Medication List, Allergies, and Problem List were reviewed in the rooming section of Epic.     ROS: Other than symptoms mentioned in the HPI, no fevers, chills, or other skin complaints    Physical Examination     GENERAL: Well-appearing female in no acute distress, resting comfortably.  NEURO: Alert and oriented, answers questions appropriately  PSYCH: Normal mood and affect  RESP: No increased work of breathing  SKIN (Full Skin Exam): Examination of the face, eyelids, lips, nose, ears, neck, chest, abdomen, back, arms, legs, hands, feet, palms, soles, nails was performed  - Hyperpigmented smoothe patches inframammary folds, R axilla, inguinal folds  - Woody induration of lower legs with marked hyperpigmentation with diffuse xerosis.   No ulcerations    All areas not commented on are within normal limits or unremarkable      (Approved Template 12/03/2019)

## 2022-05-10 NOTE — Unmapped (Signed)
Prescription refill request for clobetasol, Last office visit was 05/10/2022    Please Advise

## 2022-05-10 NOTE — Unmapped (Signed)
[facility] Refill Coordination Note    Specialty Medication(s) to be Shipped:   Inflammatory Disorders: Skyrizi    Other medication(s) to be shipped:  Clobetasol ointment 0.05%, triamcinolone ointment 0.1 %     Teresa Sloan, DOB: February 14, 1956  Phone: [phone] (home)       All above HIPAA information was verified with patient.     Was a Nurse, learning disability used for this call? No    Completed refill call assessment today to schedule patient's medication shipment from the [facility] [phone]).  All relevant notes have been reviewed.     Specialty medication(s) and dose(s) confirmed: Regimen is correct and unchanged.   Changes to medications: Ruby reports no changes at this time.  Changes to insurance: No  New side effects reported not previously addressed with a pharmacist or physician: Yes - Patient reports Break-out in underarms and groin area. Patient would not like to speak to the pharmacist today. Their provider is aware.  Questions for the pharmacist: No    Confirmed patient received a Conservation officer, historic buildings and a Surveyor, mining with first shipment. The patient will receive a drug information handout for each medication shipped and additional FDA Medication Guides as required.       DISEASE/MEDICATION-SPECIFIC INFORMATION        For patients on injectable medications: Patient currently has 0 doses left.  Next injection is scheduled for 2/29.    SPECIALTY MEDICATION ADHERENCE     Medication Adherence    Patient reported X missed doses in the last month: 0  Specialty Medication: Skyrizi  Patient is on additional specialty medications: No  Informant: caregiver  Adherence tools used: calendar              Were doses missed due to medication being on hold? No    Skyrizi 150 mg/ml: 0 days of medicine on hand     REFERRAL TO PHARMACIST     Referral to the pharmacist: Not needed      [facility]     Shipping address confirmed in Epic.     Patient was notified of new phone menu : Yes: .    Delivery Scheduled: Yes, Expected medication delivery date: 2/23.     Medication will be delivered via Same Day Courier to the prescription address in Epic WAM.    Teresa Sloan   [facility] Specialty Technician

## 2022-05-10 NOTE — Unmapped (Signed)
LOV: 05/10/22

## 2022-05-13 ENCOUNTER — Encounter: Payer: Self-pay | Admitting: Family

## 2022-05-13 ENCOUNTER — Ambulatory Visit: Payer: Self-pay | Admitting: Family

## 2022-05-13 DIAGNOSIS — Z113 Encounter for screening for infections with a predominantly sexual mode of transmission: Secondary | ICD-10-CM

## 2022-05-13 LAB — TB AG1: TB AG1 VALUE: 0.25

## 2022-05-13 LAB — TB AG2: TB AG2 VALUE: 0.21

## 2022-05-13 LAB — QUANTIFERON TB GOLD PLUS
QUANTIFERON ANTIGEN 1 MINUS NIL: 0.03 [IU]/mL
QUANTIFERON ANTIGEN 2 MINUS NIL: -0.01 [IU]/mL
QUANTIFERON MITOGEN: 9.78 [IU]/mL
QUANTIFERON TB GOLD PLUS: NEGATIVE
QUANTIFERON TB NIL VALUE: 0.22 [IU]/mL

## 2022-05-13 LAB — TB MITOGEN: TB MITOGEN VALUE: 10

## 2022-05-13 LAB — TB NIL: TB NIL VALUE: 0.22

## 2022-05-13 LAB — WET PREP FOR TRICH, YEAST, CLUE
Trichomonas Exam: NEGATIVE
Yeast Exam: NEGATIVE

## 2022-05-13 LAB — HM HIV SCREENING LAB: HM HIV Screening: NEGATIVE

## 2022-05-13 NOTE — Progress Notes (Signed)
Pt is here for STD screening.  Wet mount results reviewed, no treatment required per SO.  Condoms declined.   Windle Guard, RN

## 2022-05-13 NOTE — Progress Notes (Signed)
[address] [facility] Department  STI clinic/screening visit [place] [address] [phone]  Subjective:  Deborah Becker is a 67 y.o. female being seen today for an STI screening visit. The patient reports they do have symptoms.  Patient reports that they do not desire a pregnancy in the next year.   They reported they are not interested in discussing contraception today.    No LMP recorded. Patient is postmenopausal.  Patient has the following medical conditions:  There are no problems to display for this patient.   Chief Complaint  Patient presents with   SEXUALLY TRANSMITTED DISEASE    Screening- patient complaining of vaginal rash, vaginal itching and discharge     HPI  Patient reports itchy rash in pubic area for 6 months since starting her new psoriasis medication. Was originally prescribed betamethasone cream which controlled the itching but rash has not resolved. Advised by her dermatologist to come here to get tested and treated for vaginal infections. Her doctor plans to perform skin biopsy in the affected area if unresolved by next dermatology visit.  Does the patient using douching products? No  Last HIV test per patient/review of record was No results found for: "HMHIVSCREEN" No results found for: "HIV" Patient reports last pap was No results found for: "DIAGPAP" No results found for: "SPECADGYN"  Screening for MPX risk: Does the patient have an unexplained rash? Yes Is the patient MSM? No Does the patient endorse multiple sex partners or anonymous sex partners? No Did the patient have close or sexual contact with a person diagnosed with MPX? No Has the patient traveled outside the [place] where MPX is endemic? No Is there a high clinical suspicion for MPX-- evidenced by one of the following No  -Unlikely to be chickenpox  -Lymphadenopathy  -Rash that present in same phase of evolution on any given body part See flowsheet for further  details and programmatic requirements.   Immunization history:  Immunization History  Administered Date(s) Administered   Tdap 09/20/2015     The following portions of the patient's history were reviewed and updated as appropriate: allergies, current medications, past medical history, past social history, past surgical history and problem list.  Objective:  There were no vitals filed for this visit.  Physical Exam Vitals and nursing note reviewed.  Constitutional:      Appearance: Normal appearance.  HENT:     Head: Normocephalic and atraumatic.     Mouth/Throat:     Mouth: Mucous membranes are moist.     Pharynx: Oropharynx is clear. No oropharyngeal exudate or posterior oropharyngeal erythema.  Pulmonary:     Effort: Pulmonary effort is normal.  Abdominal:     General: Abdomen is flat.     Palpations: There is no mass.     Tenderness: There is no abdominal tenderness. There is no rebound.  Genitourinary:    General: Normal vulva.     Exam position: Lithotomy position.     Pubic Area: Rash (hyperpigmented well-defined maculopapular rash with excoriations on bilateral labia majora/minora, inner thighs, and buttock folds) present. No pubic lice.      Labia:        Right: No rash or lesion.        Left: No rash or lesion.      Vagina: Normal. No vaginal discharge (small amount white mucoid nonodorous discharge), erythema, bleeding or lesions.     Cervix: No cervical motion tenderness, discharge, friability, lesion or erythema.  Uterus: Normal.      Adnexa: Right adnexa normal and left adnexa normal.     Rectum: Normal.     Comments: pH = <4.5 Lymphadenopathy:     Head:     Right side of head: No preauricular or posterior auricular adenopathy.     Left side of head: No preauricular or posterior auricular adenopathy.     Cervical: No cervical adenopathy.     Upper Body:     Right upper body: No supraclavicular, axillary or epitrochlear adenopathy.     Left upper body:  No supraclavicular, axillary or epitrochlear adenopathy.     Lower Body: No right inguinal adenopathy. No left inguinal adenopathy.  Skin:    General: Skin is warm and dry.     Findings: No rash.  Neurological:     Mental Status: She is alert and oriented to person, place, and time.    Assessment and Plan:  Deborah Becker is a 67 y.o. female presenting to the [facility] Department for STI screening  1. Screening for venereal disease Will contact if any results positive Discussed perineal hygiene Continue cream prescribed by dermatologist  Keep F/U appointment with dermatologist Continue abstinence for prevention of STI  - Chlamydia/Gonorrhea [facility] - WET PREP FOR [place], YEAST, CLUE - Syphilis Serology, [place] [facility]   Patient accepted all screenings including vaginal CT/GC and bloodwork for HIV/RPR, and wet prep. Patient meets criteria for HepB screening? No. Ordered? no Patient meets criteria for HepC screening? No. Ordered? no  Treat wet prep per standing order Discussed time line for [facility] results and that patient will be called with positive results and encouraged patient to call if she had not heard in 2 weeks.  Counseled to return or seek care for continued or worsening symptoms Recommended repeat testing in 3 months with positive results. Recommended condom use with all sex  Patient is currently using abstinence to prevent pregnancy.    Return if symptoms worsen or fail to improve.  No future appointments.  Marline Backbone, FNP

## 2022-05-14 NOTE — Unmapped (Signed)
Teresa Sloan 's Skyrizi shipment will be delayed as a result of insurance processor is down.     I have reached out to the patient  at [phone] and communicated the delivery change. We will reschedule the medication for the delivery date that the patient agreed upon.  We have confirmed the delivery date as 2/27, via same day courier.

## 2022-06-01 NOTE — Unmapped (Signed)
Received fax from [facility] that ketoconazole 2% shampoo has quantity limits per 30 days.  Letter scanned to epic media tab.

## 2022-06-02 NOTE — Unmapped (Signed)
Received fax from [employer] (scanned to media tab) that there could be potential formulary issues for clobetasol 0.05% ointment.  TC to [employer], no issues with insurance payment when drug filled at this time, no need for pa.  Pharmacy will reach out to clinic if any future issues and can provide formulary alternatives at that time if needed.

## 2022-06-21 ENCOUNTER — Ambulatory Visit: Admit: 2022-06-21 | Discharge: 2022-06-22 | Payer: MEDICARE

## 2022-06-21 DIAGNOSIS — L304 Erythema intertrigo: Principal | ICD-10-CM

## 2022-06-21 DIAGNOSIS — L409 Psoriasis, unspecified: Principal | ICD-10-CM

## 2022-06-21 MED ORDER — KETOCONAZOLE 2 % SHAMPOO
TOPICAL | 11 refills | 0.00000 days | Status: CP
Start: 2022-06-21 — End: 2022-07-21

## 2022-06-21 MED ORDER — CLOBETASOL 0.05 % TOPICAL OINTMENT
Freq: Two times a day (BID) | TOPICAL | 1 refills | 0.00000 days | Status: CP
Start: 2022-06-21 — End: 2023-06-21

## 2022-06-21 MED ORDER — CLINDAMYCIN 1 % LOTION
Freq: Two times a day (BID) | TOPICAL | 11 refills | 0.00000 days | Status: CP
Start: 2022-06-21 — End: ?

## 2022-06-21 MED ORDER — TRIAMCINOLONE ACETONIDE 0.1 % TOPICAL OINTMENT
Freq: Two times a day (BID) | TOPICAL | 2 refills | 0.00000 days | Status: CP
Start: 2022-06-21 — End: ?
  Filled 2022-07-29: qty 80, 20d supply, fill #1

## 2022-06-21 NOTE — Unmapped (Signed)
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 May have component of intertrigo vs erythrasma, now better with ketoconazole 2% cream and clindamycin 1% lotion daily for a month. Refilled today  - Continue triamcinolone 0.1% ointment twice a day until lesions resolve for legs, advised to hold off on intertriginous areas. Refilled today  - Continue Skyrizi 150 mg every 12 weeks. 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     RTC: Return in about 6 months (around 12/21/2022) for Recheck. or sooner as needed   _________________________________________________________________      Chief Complaint     Chief Complaint   Patient presents with    Psoriasis     Getting Better       HPI     Teresa Sloan is a 67 y.o. female who presents as a returning patient (last seen 05/10/2022) to Dermatology for follow up of psoriais. At last visit patient was continued on skyrizi 150 mg every 12 weeks.    Today they report the following:  - Doing well  - Itching is much better   - No side effects with skyrizi noted  - Using topicals as discussed  - Has some new itchy areas on chest and upper arms     The patient denies any other new or changing lesions or areas of concern.     Pertinent Past Medical History     No history of skin cancer    Problem List Musculoskeletal and Integument    Psoriasis    Relevant Medications    triamcinolone (KENALOG) 0.1 % ointment    clobetasol (TEMOVATE) 0.05 % ointment       Family History:   Negative for melanoma    Past Medical History, Family History, Social History, Medication List, Allergies, and Problem List were reviewed in the rooming section of Epic.     ROS: Other than symptoms mentioned in the HPI, no fevers, chills, or other skin complaints    Physical Examination     GENERAL: Well-appearing female in no acute distress, resting comfortably.  NEURO: Alert and oriented, answers questions appropriately  PSYCH: Normal mood and affect  RESP: No increased work of breathing  SKIN (Full Skin Exam): Examination of the face, eyelids, lips, nose, ears, neck, chest, abdomen, back, arms, legs, hands, feet, palms, soles, nails was performed  - Hyperpigmented smoothe patches inframammary folds, R axilla, inguinal folds, interval improvement from last visit  - Woody induration of lower legs with marked hyperpigmentation with diffuse xerosis.   No ulcerations  - Hyperpigmented scaly papules on chest and upper extremities    All areas not commented on are within normal limits or unremarkable      (Approved Template 12/03/2019)

## 2022-06-23 NOTE — Unmapped (Signed)
Hello,       Patient called in stated that she wasn't able to pick up two of her prescription because she needs PA.      PA is needed for   clobetasol (TEMOVATE) 0.05 % ointment clindamycin (CLEOCIN T) 1 % lotion

## 2022-06-25 NOTE — Unmapped (Signed)
Inbasket received requesting PA for Clobetasol, PA was initiated via [employer]

## 2022-07-22 NOTE — Unmapped (Signed)
[facility] Refill Coordination Note    Specialty Medication(s) to be Shipped:   Inflammatory Disorders: Skyrizi    Other medication(s) to be shipped:   triamcinolone ointment 0.1 %     Teresa Sloan, DOB: 1955/08/30  Phone: [phone] (home)       All above HIPAA information was verified with patient.     Was a Nurse, learning disability used for this call? No    Completed refill call assessment today to schedule patient's medication shipment from the [facility] [phone]).  All relevant notes have been reviewed.     Specialty medication(s) and dose(s) confirmed: Regimen is correct and unchanged.   Changes to medications: Noelle reports no changes at this time.  Changes to insurance: No  New side effects reported not previously addressed with a pharmacist or physician: None reported  Questions for the pharmacist: No    Confirmed patient received a Conservation officer, historic buildings and a Surveyor, mining with first shipment. The patient will receive a drug information handout for each medication shipped and additional FDA Medication Guides as required.       DISEASE/MEDICATION-SPECIFIC INFORMATION        For patients on injectable medications: Patient currently has 0 doses left.  Next injection is scheduled for 5/23.    SPECIALTY MEDICATION ADHERENCE     Medication Adherence    Specialty Medication: risankizumab-rzaa (SKYRIZI) 150 mg/mL PnIj  Adherence tools used: calendar              Were doses missed due to medication being on hold? No    Skyrizi 150 mg/ml: 0 days of medicine on hand     REFERRAL TO PHARMACIST     Referral to the pharmacist: Not needed      [facility]     Shipping address confirmed in Epic.     Patient was notified of new phone menu : Yes: .    Delivery Scheduled: Yes, Expected medication delivery date: 07/29/2022.     Medication will be delivered via Same Day Courier to the prescription address in Epic WAM.    [name]   [facility] Specialty Technician

## 2022-07-29 MED FILL — SKYRIZI 150 MG/ML SUBCUTANEOUS PEN INJECTOR: 84 days supply | Qty: 1 | Fill #1

## 2022-08-05 NOTE — Unmapped (Signed)
Fax received requesting PA for clindamycin lotion PA was initiated via [employer]

## 2022-08-30 MED ORDER — CLOBETASOL 0.05 % TOPICAL OINTMENT
Freq: Two times a day (BID) | TOPICAL | 1 refills | 0.00000 days
Start: 2022-08-30 — End: 2023-08-30

## 2022-08-31 MED ORDER — CLOBETASOL 0.05 % TOPICAL OINTMENT
Freq: Two times a day (BID) | TOPICAL | 1 refills | 0.00000 days | Status: CP
Start: 2022-08-31 — End: 2023-08-31

## 2022-08-31 NOTE — Unmapped (Signed)
Referral Request     Patient Name: Teresa Sloan   Caller: Self (Patient)  Contact Method: Telephone Call: Time- Any Time [phone]   Referral Requested to: New referral for a Mammo  Preferred Facility: [facility]  Reason for Referral: Mammo  Check Epic for Referral Order - Present?: yes Expired

## 2022-08-31 NOTE — Unmapped (Signed)
Mammo Referral request  ( Not sure why it came up this way)

## 2022-10-07 ENCOUNTER — Ambulatory Visit: Admit: 2022-10-07 | Discharge: 2022-10-08 | Payer: MEDICARE

## 2022-10-25 NOTE — Unmapped (Signed)
[facility] Clinical Assessment & Refill Coordination Note    Teresa Sloan, DOB: 1955-10-09  Phone: [phone] (home)     All above HIPAA information was verified with patient.     Was a Nurse, learning disability used for this call? No    Specialty Medication(s):   Inflammatory Disorders: Skyrizi     Current Outpatient Medications   Medication Sig Dispense Refill    amLODIPine (NORVASC) 10 MG tablet Take 1 tablet (10 mg total) by mouth daily. 30 tablet 11    ammonium lactate (LAC-HYDRIN) 12 % lotion Apply 1 application topically once daily. 400 g 6    benzoyl peroxide 10 % Clsr Wash affected area under arms each day or a few times weekly as tolerated. Lather 1 minute before rinsing 148 mL 2    ciclopirox (PENLAC) 8 % solution Apply over nail and surrounding skin. Apply daily over previous coat. After seven (7) days, may remove with alcohol and continue cycle. 6.6 mL 1    clindamycin (CLEOCIN T) 1 % lotion Apply topically two (2) times a day. To affected areas in armpit, groin, abdomen x 3-4 weeks, less if resolved 120 mL 11    clobetasoL (TEMOVATE) 0.05 % ointment Apply to thick areas of psoriasis twice daily as needed. Avoid face and skin folds 60 g 3    clobetasol (TEMOVATE) 0.05 % ointment Apply topically Two (2) times a day. 60 g 1    empty container (SHARPS-A-GATOR DISPOSAL SYSTEM) Misc use to dispose of needles 1 each 2    empty container Misc Use as directed to dispose of Cosentyx pens. 1 each 2    hydroCHLOROthiazide (HYDRODIURIL) 25 MG tablet Take 1 tablet (25 mg total) by mouth daily. 30 tablet 11    lisinopriL (PRINIVIL,ZESTRIL) 20 MG tablet Take 1 tablet (20 mg total) by mouth daily. For high blood pressure 90 tablet 3    methocarbamol (ROBAXIN) 750 MG tablet Take 1 tablet (750 mg total) by mouth four (4) times a day.      risankizumab-rzaa (SKYRIZI) 150 mg/mL PnIj Inject the contents of 1 pen (150 mg total) under the skin every 12 weeks. 1 mL 3    tretinoin (RETIN-A) 0.05 % cream Apply thin layer to underarms a few nights a week. Increase as tolerated. 45 g 5    triamcinolone (KENALOG) 0.1 % ointment Apply to thin areas of psoriasis twice daily as needed 80 g 6    triamcinolone (KENALOG) 0.1 % ointment Apply topically two (2) times a day. 454 g 2    urea (CARMOL) 20 % cream Apply daily as needed to soften psoriasis areas on body - can combine with clobetasol 85 g 6     No current facility-administered medications for this visit.        Changes to medications: Teresa Sloan reports no changes at this time.    No Known Allergies    Changes to allergies: No    SPECIALTY MEDICATION ADHERENCE     Skyrizi 150mg /mL : 0 days of medicine on hand       Medication Adherence    Patient reported X missed doses in the last month: 0  Specialty Medication: Skyrizi 150mg /mL  Informant: patient  Adherence tools used: calendar  Confirmed plan for next specialty medication refill: delivery by pharmacy  Refills needed for supportive medications: not needed          Specialty medication(s) dose(s) confirmed: Regimen is correct and unchanged.     Are  there any concerns with adherence? No    Adherence counseling provided? Not needed    CLINICAL MANAGEMENT AND INTERVENTION      Clinical Benefit Assessment:    Do you feel the medicine is effective or helping your condition? Yes    Clinical Benefit counseling provided? Not needed    Adverse Effects Assessment:    Are you experiencing any side effects? No    Are you experiencing difficulty administering your medicine? No    Quality of Life Assessment:    Quality of Life    Rheumatology  Oncology  Dermatology  1. What impact has your specialty medication had on the symptoms of your skin condition (i.e. itchiness, soreness, stinging)?: Tremendous  2. What impact has your specialty medication had on your comfort level with your skin?: Tremendous  Cystic Fibrosis          How many days over the past month did your Psoriasis  keep you from your normal activities? For example, brushing your teeth or getting up in the morning. Patient declined to answer    Have you discussed this with your provider? Not needed    Acute Infection Status:    Acute infections noted within Epic:  No active infections  Patient reported infection: None    Therapy Appropriateness:    Is therapy appropriate based on current medication list, adverse reactions, adherence, clinical benefit and progress toward achieving therapeutic goals? Yes, therapy is appropriate and should be continued     DISEASE/MEDICATION-SPECIFIC INFORMATION      For patients on injectable medications: Patient currently has 0 doses left.  Next injection is scheduled for 11/02/2022.    Chronic Inflammatory Diseases: Have you experienced any flares in the last month? No  Has this been reported to your provider? Not applicable    PATIENT SPECIFIC NEEDS     Does the patient have any physical, cognitive, or cultural barriers? No    Is the patient high risk? No    Did the patient require a clinical intervention? No    Does the patient require physician intervention or other additional services (i.e., nutrition, smoking cessation, social work)? No    SOCIAL DETERMINANTS OF HEALTH     At the [facility], we have learned that life circumstances - like trouble affording food, housing, utilities, or transportation can affect the health of many of our patients.   That is why we wanted to ask: are you currently experiencing any life circumstances that are negatively impacting your health and/or quality of life? Patient declined to answer    Social Determinants of Health     Financial Resource Strain: Low Risk  (11/21/2019)    Overall Financial Resource Strain (CARDIA)     Difficulty of Paying Living Expenses: Not very hard   Internet Connectivity: Not on file   Food Insecurity: No Food Insecurity (11/21/2019)    Hunger Vital Sign     Worried About Running Out of Food in the Last Year: Never true     Ran Out of Food in the Last Year: Never true   Tobacco Use: Low Risk  (06/21/2022)    Patient History     Smoking Tobacco Use: Never     Smokeless Tobacco Use: Never     Passive Exposure: Never   Housing/Utilities: Low Risk  (11/21/2019)    Housing/Utilities     Within the past 12 months, have you ever stayed: outside, in a car, in a tent, in an overnight shelter, or  temporarily in someone else's home (i.e. couch-surfing)?: No     Are you worried about losing your housing?: No     Within the past 12 months, have you been unable to get utilities (heat, electricity) when it was really needed?: No   Alcohol Use: Not on file   Transportation Needs: No Transportation Needs (11/21/2019)    PRAPARE - Therapist, art (Medical): No     Lack of Transportation (Non-Medical): No   Substance Use: Not on file   Health Literacy: Not on file   Physical Activity: Not on file   Interpersonal Safety: Unknown (10/25/2022)    Interpersonal Safety     Unsafe Where You Currently Live: Not on file     Physically Hurt by Anyone: Not on file     Abused by Anyone: Not on file   Stress: Not on file   Intimate Partner Violence: Not on file   Depression: Not at risk (11/01/2019)    PHQ-2     PHQ-2 Score: 0   Social Connections: Not on file       Would you be willing to receive help with any of the needs that you have identified today? Not applicable       SHIPPING     Specialty Medication(s) to be Shipped:   Inflammatory Disorders: Skyrizi    Other medication(s) to be shipped: No additional medications requested for fill at this time     Changes to insurance: No    Delivery Scheduled: Yes, Expected medication delivery date: 10/28/2022.     Medication will be delivered via Same Day Courier to the confirmed prescription address in [facility]    The patient will receive a drug information handout for each medication shipped and additional FDA Medication Guides as required.  Verified that patient has previously received a Conservation officer, historic buildings and a Surveyor, mining.    The patient or caregiver noted above participated in the development of this care plan and knows that they can request review of or adjustments to the care plan at any time.      All of the patient's questions and concerns have been addressed.    Elnora Morrison, PharmD   [facility] Specialty Pharmacist

## 2022-10-28 MED FILL — SKYRIZI 150 MG/ML SUBCUTANEOUS PEN INJECTOR: 84 days supply | Qty: 1 | Fill #2
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
## 2022-11-04 ENCOUNTER — Ambulatory Visit: Admit: 2022-11-04 | Payer: MEDICARE | Attending: Family | Primary: Family

## 2022-11-04 DIAGNOSIS — L081 Erythrasma: Principal | ICD-10-CM

## 2022-11-04 MED ORDER — BENZOYL PEROXIDE 10 % TOPICAL CLEANSER
0 refills | 0.00000 days
Start: 2022-11-04 — End: ?

## 2022-11-04 NOTE — Unmapped (Signed)
LOV 06/21/22  Rx request for BPO 10% cleanser

## 2022-11-05 MED ORDER — BENZOYL PEROXIDE 10 % TOPICAL CLEANSER
0 refills | 0.00000 days | Status: CP
Start: 2022-11-05 — End: ?

## 2022-12-23 DIAGNOSIS — L409 Psoriasis, unspecified: Principal | ICD-10-CM

## 2022-12-23 MED ORDER — TRIAMCINOLONE ACETONIDE 0.1 % TOPICAL OINTMENT
INTRAMUSCULAR | 2 refills | 0.00000 days | Status: CP
Start: 2022-12-23 — End: ?
  Filled 2023-01-20: qty 80, 20d supply, fill #2

## 2022-12-24 MED ORDER — BETAMETHASONE DIPROPIONATE 0.05 % TOPICAL OINTMENT
Freq: Two times a day (BID) | TOPICAL | 0 refills | 0.00000 days | Status: CP
Start: 2022-12-24 — End: 2023-12-24

## 2022-12-24 NOTE — Unmapped (Signed)
Pharmacy medication change request LOV  06/21/22

## 2023-01-12 NOTE — Unmapped (Signed)
[facility] Refill Coordination Note    Specialty Medication(s) to be Shipped:   Inflammatory Disorders: Skyrizi    Other medication(s) to be shipped:   triamcinolone ointment 0.1 %     Teresa Sloan, DOB: 1956/01/15  Phone: [phone] (home)       All above HIPAA information was verified with patient.     Was a Nurse, learning disability used for this call? No    Completed refill call assessment today to schedule patient's medication shipment from the [facility] [phone]).  All relevant notes have been reviewed.     Specialty medication(s) and dose(s) confirmed: Regimen is correct and unchanged.   Changes to medications: Shamica reports no changes at this time.  Changes to insurance: No  New side effects reported not previously addressed with a pharmacist or physician: None reported  Questions for the pharmacist: No    Confirmed patient received a Conservation officer, historic buildings and a Surveyor, mining with first shipment. The patient will receive a drug information handout for each medication shipped and additional FDA Medication Guides as required.       DISEASE/MEDICATION-SPECIFIC INFORMATION        For patients on injectable medications: Patient currently has 0 doses left.  Next injection is scheduled for 11/5.    SPECIALTY MEDICATION ADHERENCE     Medication Adherence    Patient reported X missed doses in the last month: 0  Specialty Medication: risankizumab-rzaa (SKYRIZI) 150 mg/mL PnIj  Patient is on additional specialty medications: No  Informant: patient  Adherence tools used: calendar              Were doses missed due to medication being on hold? No    Skyrizi 150 mg/ml: 0 days of medicine on hand     REFERRAL TO PHARMACIST     Referral to the pharmacist: Not needed      [facility]     Shipping address confirmed in Epic.     Patient was notified of new phone menu : Yes: .    Delivery Scheduled: Yes, Expected medication delivery date: 01/20/2023.     Medication will be delivered via Same Day Courier to the prescription address in Epic WAM.    Teresa Sloan   [facility] Specialty Technician

## 2023-01-20 MED FILL — SKYRIZI 150 MG/ML SUBCUTANEOUS PEN INJECTOR: 84 days supply | Qty: 1 | Fill #3

## 2023-04-11 DIAGNOSIS — L409 Psoriasis, unspecified: Principal | ICD-10-CM

## 2023-04-11 MED ORDER — RISANKIZUMAB-RZAA 150 MG/ML SUBCUTANEOUS PEN INJECTOR
3 refills | 0.00 days
Start: 2023-04-11 — End: ?

## 2023-04-11 NOTE — Unmapped (Signed)
[facility] Refill Coordination Note    Specialty Medication(s) to be Shipped:   Inflammatory Disorders: Skyrizi    Other medication(s) to be shipped: No additional medications requested for fill at this time     Teresa Sloan, DOB: 1955/10/16  Phone: [phone] (home)       All above HIPAA information was verified with patient.     Was a Nurse, learning disability used for this call? No    Completed refill call assessment today to schedule patient's medication shipment from the [facility] [phone]).  All relevant notes have been reviewed.     Specialty medication(s) and dose(s) confirmed: Regimen is correct and unchanged.   Changes to medications: Natale reports no changes at this time.  Changes to insurance: No  New side effects reported not previously addressed with a pharmacist or physician: None reported  Questions for the pharmacist: No    Confirmed patient received a Conservation officer, historic buildings and a Surveyor, mining with first shipment. The patient will receive a drug information handout for each medication shipped and additional FDA Medication Guides as required.       DISEASE/MEDICATION-SPECIFIC INFORMATION        For patients on injectable medications: Patient currently has 0 doses left.  Next injection is scheduled for 1/28.    SPECIALTY MEDICATION ADHERENCE     Medication Adherence    Patient reported X missed doses in the last month: 0  Specialty Medication: risankizumab-rzaa (SKYRIZI) 150 mg/mL PnIj  Patient is on additional specialty medications: No  Informant: patient  Adherence tools used: calendar              Were doses missed due to medication being on hold? No    Skyrizi 150 mg/ml: 0 days of medicine on hand     REFERRAL TO PHARMACIST     Referral to the pharmacist: Not needed      [facility]     Shipping address confirmed in Epic.     Patient was notified of new phone menu : Yes: .    Delivery Scheduled: Yes, Expected medication delivery date: 1/24.  However, Rx request for refills was sent to the provider as there are none remaining.     Medication will be delivered via Same Day Courier to the prescription address in Epic WAM.    [name]   [facility] Specialty Technician

## 2023-04-12 MED ORDER — RISANKIZUMAB-RZAA 150 MG/ML SUBCUTANEOUS PEN INJECTOR
3 refills | 0.00 days | Status: CP
Start: 2023-04-12 — End: ?
  Filled 2023-04-20: qty 1, 84d supply, fill #0

## 2023-04-12 NOTE — Unmapped (Signed)
Prescription refill request for skyrizi, 150mg /ml Last office visit was 06/21/22    Please Advise

## 2023-04-16 NOTE — Unmapped (Signed)
Teresa Sloan 's Skyrizi shipment will be delayed as a result of no credit card on file to collect copayment.     I have reached out to the patient  at [phone]  and communicated the delay. We will wait for a call back from the patient to reschedule the delivery.  We have not confirmed the new delivery date.

## 2023-04-20 NOTE — Unmapped (Signed)
Teresa Sloan 's Skyrizi shipment will be rescheduled as a result of credit card on file and copay collected.     [name] has spoken with the patient  via incoming phone call and communicated the delivery change. We will reschedule the medication for the delivery date that the patient agreed upon.  We have confirmed the delivery date as 04/20/2023, via same day courier.

## 2023-04-21 DIAGNOSIS — L409 Psoriasis, unspecified: Principal | ICD-10-CM

## 2023-04-21 MED ORDER — BETAMETHASONE DIPROPIONATE 0.05 % TOPICAL OINTMENT
Freq: Two times a day (BID) | TOPICAL | 2 refills | 0.00 days | Status: CP
Start: 2023-04-21 — End: 2024-04-20

## 2023-07-07 NOTE — Unmapped (Signed)
 [facility] Refill Coordination Note    Specialty Medication(s) to be Shipped:   Inflammatory Disorders: Skyrizi     Other medication(s) to be shipped: No additional medications requested for fill at this time     Teresa Sloan, DOB: Jul 24, 1955  Phone: [phone] (home)       All above HIPAA information was verified with patient.     Was a Nurse, learning disability used for this call? No    Completed refill call assessment today to schedule patient's medication shipment from the [facility]  [phone]).  All relevant notes have been reviewed.     Specialty medication(s) and dose(s) confirmed: Regimen is correct and unchanged.   Changes to medications: Teresa Sloan reports no changes at this time.  Changes to insurance: No  New side effects reported not previously addressed with a pharmacist or physician: None reported  Questions for the pharmacist: No    Confirmed patient received a Conservation officer, historic buildings and a Surveyor, mining with first shipment. The patient will receive a drug information handout for each medication shipped and additional FDA Medication Guides as required.       DISEASE/MEDICATION-SPECIFIC INFORMATION        For patients on injectable medications: Patient currently has 0 doses left.  Next injection is scheduled for 4/22.    SPECIALTY MEDICATION ADHERENCE     Medication Adherence    Patient reported X missed doses in the last month: 0  Specialty Medication: SKYRIZI  150 mg/mL Pnij (risankizumab -rzaa)  Patient is on additional specialty medications: No  Patient is on more than two specialty medications: No  Any gaps in refill history greater than 2 weeks in the last 3 months: no  Demonstrates understanding of importance of adherence: yes  Informant: patient  Adherence tools used: calendar  Confirmed plan for next specialty medication refill: delivery by pharmacy  Refills needed for supportive medications: not needed          Refill Coordination    Has the Patients' Contact Information Changed: No  Is the Shipping Address Different: No         Were doses missed due to medication being on hold? No    SKYRIZI  150   mg/ml: 0 doses of medicine on hand     REFERRAL TO PHARMACIST     Referral to the pharmacist: Not needed      [facility]     Shipping address confirmed in Epic.     Cost and Payment: Patient has a $0 copay, payment information is not required.    Delivery Scheduled: Yes, Expected medication delivery date: 07/11/23.     Medication will be delivered via Same Day Courier to the prescription address in Epic WAM.    Teresa Sloan   [facility]  Specialty Technician

## 2023-07-08 DIAGNOSIS — L304 Erythema intertrigo: Principal | ICD-10-CM

## 2023-07-08 MED ORDER — CLINDAMYCIN 1 % LOTION
Freq: Two times a day (BID) | TOPICAL | 0 refills | 0.00 days | Status: CP
Start: 2023-07-08 — End: ?

## 2023-07-08 NOTE — Unmapped (Signed)
 Patient lvm on nurse line requesting refill for her shampoo and lotion.  LOV 06/21/22 and was to RTC in 6 months.    Needs ov scheduled .  Pended short bridge of both of these if applicable.

## 2023-07-08 NOTE — Unmapped (Signed)
 Appointment on 04/21 in [facility]    Thanks  [place]

## 2023-07-10 NOTE — Unmapped (Signed)
 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   - Continue Skyrizi  150 mg every 12 weeks.     Intertrigo, chronic, flaring:  - Patient endorses ongoing itching and malodor of all intertriginous sites (had been using lots of steroid there).Suspect component of intertrigo vs erythrasma  - In the AM, start clindamycin  (CLEOCIN  T) 1 % lotion; To affected areas in armpit, groin, abdomen daily  - In the PM, start ketoconazole  (NIZORAL ) 2 % cream; Apply 0.25 grams to affected areas underarms, chest, groin daily  - At all other times, apply barrier cream with zinc oxide     High risk medication use (on Skyrizi ):  - Obtain annual Quantiferon TB Gold Plus; Future    The patient was advised to call for an appointment should any new, changing, or symptomatic lesions develop.     RTC: No follow-ups on file. or sooner as needed   _________________________________________________________________  Chief Complaint     Chief Complaint   Patient presents with    Psoriasis     Psorasis follow up. Has black rash between legs/thighs and bilateral underarms. Is a odor at rash in the underarms. Rashes are itchy and burning     HPI     Teresa Sloan is a 68 y.o. female who presents as a returning patient (last seen 06/21/2022) to Dermatology for follow up of psoriasis. At last visit, continued Skyrizi . Treated for concomitant erythrasma/intertrigo. Today patient reports,    History of Present Illness  The patient, with a history of psoriasis, presents for follow-up. She reports significant improvement in her psoriasis with Skyrizi . However, she has developed new skin lesions in the axillary, inframammary, and abdominal regions. Despite treatment with clobetasol  and triamcinolone , the lesions persist. She describes the lesions as uncomfortable, malodorous, and resistant to treatment. The patient also reports using a shampoo and lotion for symptom management.       The patient denies any other new or changing lesions or areas of concern.     Pertinent Past Medical History     No history of skin cancer    Problem List    None    Family History:   Negative for melanoma    Past Medical History, Family History, Social History, Medication List, Allergies, and Problem List were reviewed in the rooming section of Epic.     ROS: Other than symptoms mentioned in the HPI, no fevers, chills, or other skin complaints    Physical Examination     GENERAL: Well-appearing female in no acute distress, resting comfortably.  NEURO: Alert and oriented, answers questions appropriately  SKIN: Examination of the bilateral axillae, inguinal creases, infrapannus, extremities was performed  - Xerosis diffusely  - Macerated hyperpigmented patches to bilateral axillae  - Erythematous macerated patches to infrapannus and inguinal creases  - Scattered hyperpigmented patches at sites of resolved psoriasis on extremities     All areas not commented on are within normal limits or unremarkable    (Approved Template 12/03/2019)

## 2023-07-11 ENCOUNTER — Ambulatory Visit: Admit: 2023-07-11 | Discharge: 2023-07-11 | Payer: MEDICARE

## 2023-07-11 DIAGNOSIS — Z79899 Other long term (current) drug therapy: Principal | ICD-10-CM

## 2023-07-11 DIAGNOSIS — L409 Psoriasis, unspecified: Principal | ICD-10-CM

## 2023-07-11 DIAGNOSIS — L304 Erythema intertrigo: Principal | ICD-10-CM

## 2023-07-11 MED ORDER — KETOCONAZOLE 2 % TOPICAL CREAM
5 refills | 0.00 days | Status: CP
Start: 2023-07-11 — End: ?

## 2023-07-11 MED ORDER — CLINDAMYCIN 1 % LOTION
10 refills | 0.00 days | Status: CP
Start: 2023-07-11 — End: ?

## 2023-07-11 MED ORDER — KETOCONAZOLE 2 % SHAMPOO
TOPICAL | 0 refills | 0.00 days | Status: CP
Start: 2023-07-11 — End: ?

## 2023-07-11 MED FILL — SKYRIZI 150 MG/ML SUBCUTANEOUS PEN INJECTOR: 84 days supply | Qty: 1 | Fill #1

## 2023-07-11 NOTE — Unmapped (Signed)
Thank you for choosing [facility].  If you have any other questions or concerns, please contact us using [facility] or call [phone].      If you would like to get into contact with your dermatologist, please call our office at [phone] or, for non-urgent questions, send a MyChart message to the attending physician for the visit. Any message sent to the visit's attending physician on MyChart will be routed to the resident physician who participated in your care. Please allow 3 business days for response via MyChart.

## 2023-07-12 DIAGNOSIS — L409 Psoriasis, unspecified: Principal | ICD-10-CM

## 2023-07-12 MED ORDER — TRIAMCINOLONE ACETONIDE 0.1 % TOPICAL OINTMENT
Freq: Two times a day (BID) | TOPICAL | 2 refills | 0.00 days | Status: CP
Start: 2023-07-12 — End: ?

## 2023-07-12 NOTE — Unmapped (Signed)
 Patient Teresa Sloan requesting refill for her psoriasis  Pended Rx for triamcinolone  ointment.

## 2023-07-13 DIAGNOSIS — L409 Psoriasis, unspecified: Principal | ICD-10-CM

## 2023-07-13 LAB — QUANTIFERON TB GOLD PLUS
QUANTIFERON ANTIGEN 1 MINUS NIL: -0.01 [IU]/mL
QUANTIFERON ANTIGEN 2 MINUS NIL: -0.01 [IU]/mL
QUANTIFERON MITOGEN: 9.77 [IU]/mL
QUANTIFERON TB GOLD PLUS: NEGATIVE
QUANTIFERON TB NIL VALUE: 0.23 [IU]/mL

## 2023-07-13 LAB — TB NIL: TB NIL VALUE: 0.23

## 2023-07-13 LAB — TB AG2: TB AG2 VALUE: 0.22

## 2023-07-13 LAB — TB AG1: TB AG1 VALUE: 0.22

## 2023-07-13 LAB — TB MITOGEN: TB MITOGEN VALUE: 10

## 2023-07-13 MED ORDER — CLOBETASOL 0.05 % TOPICAL OINTMENT
Freq: Two times a day (BID) | TOPICAL | 1 refills | 0.00 days | Status: CP
Start: 2023-07-13 — End: 2024-07-12

## 2023-07-14 DIAGNOSIS — L409 Psoriasis, unspecified: Principal | ICD-10-CM

## 2023-07-14 DIAGNOSIS — L304 Erythema intertrigo: Principal | ICD-10-CM

## 2023-07-14 MED ORDER — CLINDAMYCIN 1 % LOTION
10 refills | 0.00 days | Status: CP
Start: 2023-07-14 — End: ?

## 2023-07-14 MED ORDER — KETOCONAZOLE 2 % SHAMPOO
TOPICAL | 5 refills | 0.00 days | Status: CP
Start: 2023-07-14 — End: ?

## 2023-07-14 NOTE — Unmapped (Signed)
 Patient notified via telephone . Annual Tb negative. Continue Skyrizi  as planned.

## 2023-07-14 NOTE — Unmapped (Signed)
 Called patient to notify of negative annual Tb test. She also inquired about topical medicines she needs refills on: TCS refilled already and prescription for keto shampoo and clindamycin  lotion sent.

## 2023-09-02 ENCOUNTER — Ambulatory Visit (INDEPENDENT_AMBULATORY_CARE_PROVIDER_SITE_OTHER): Admitting: Cardiology

## 2023-09-02 ENCOUNTER — Encounter: Payer: Self-pay | Admitting: Cardiology

## 2023-09-02 VITALS — BP 126/86 | HR 83 | Ht 67.0 in | Wt 280.0 lb

## 2023-09-02 DIAGNOSIS — Z1231 Encounter for screening mammogram for malignant neoplasm of breast: Secondary | ICD-10-CM

## 2023-09-02 DIAGNOSIS — L409 Psoriasis, unspecified: Secondary | ICD-10-CM

## 2023-09-02 DIAGNOSIS — Z1329 Encounter for screening for other suspected endocrine disorder: Secondary | ICD-10-CM

## 2023-09-02 DIAGNOSIS — Z0130 Encounter for examination of blood pressure without abnormal findings: Secondary | ICD-10-CM

## 2023-09-02 DIAGNOSIS — Z131 Encounter for screening for diabetes mellitus: Secondary | ICD-10-CM

## 2023-09-02 DIAGNOSIS — Z7689 Persons encountering health services in other specified circumstances: Secondary | ICD-10-CM | POA: Insufficient documentation

## 2023-09-02 DIAGNOSIS — E66813 Obesity, class 3: Secondary | ICD-10-CM | POA: Insufficient documentation

## 2023-09-02 DIAGNOSIS — Z13220 Encounter for screening for lipoid disorders: Secondary | ICD-10-CM

## 2023-09-02 DIAGNOSIS — Z6841 Body Mass Index (BMI) 40.0 and over, adult: Secondary | ICD-10-CM

## 2023-09-02 NOTE — Progress Notes (Signed)
 New Patient Office Visit  Subjective   Patient ID: Deborah Becker, female    DOB: [birth year]  Age: 68 y.o. MRN: [id number]  CC:  Chief Complaint  Patient presents with   New Patient (Initial Visit)    New Patient     HPI Deborah Becker presents to establish care Previous Primary Care provider/office:   she does not have additional concerns to discuss today.   Patient in office to establish care. Patient doing well, no new complaints today.  Due for fasting lab work. Will return, will call with results. Due for mammogram, order placed. Patient driving to [facility] for dermatology, for her psoriasis. Requesting a referral for somewhere closer, referral placed. Patient has a rash under both arms, ketoconazole prescribed by derm, patient reports it doesn't help.    Outpatient Encounter Medications as of 09/02/2023  Medication Sig   betamethasone dipropionate (DIPROLENE) 0.05 % ointment Apply topically.   clindamycin (CLEOCIN T) 1 % lotion Apply topically.   clobetasol ointment (TEMOVATE) 0.05 % Apply topically.   ketoconazole (NIZORAL) 2 % cream Apply topically.   ketoconazole (NIZORAL) 2 % shampoo Apply topically once a week.   risankizumab-rzaa (SKYRIZI) 150 MG/ML pen Inject the contents of 1 pen (150 mg total) under the skin every 12 weeks.   triamcinolone ointment (KENALOG) 0.1 % Apply topically 2 (two) times daily.   No facility-administered encounter medications on file as of 09/02/2023.    Past Medical History:  Diagnosis Date   Psoriasis     Past Surgical History:  Procedure Laterality Date   KNEE SURGERY      Family History  Problem Relation Age of Onset   Breast cancer Mother 25   Breast cancer Sister 22   Breast cancer Maternal Aunt 43    Social History   Socioeconomic History   Marital status: Legally Separated    Spouse name: Not on file   Number of children: Not on file   Years of education: Not on file   Highest education level: Not on  file  Occupational History   Not on file  Tobacco Use   Smoking status: Never   Smokeless tobacco: Not on file  Substance and Sexual Activity   Alcohol use: No   Drug use: Not on file   Sexual activity: Not on file  Other Topics Concern   Not on file  Social History Narrative   Not on file   Social Drivers of Health   Financial Resource Strain: Low Risk  (11/21/2019)   Received from [facility]   Overall Financial Resource Strain (CARDIA)    Difficulty of Paying Living Expenses: Not very hard  Food Insecurity: No Food Insecurity (11/21/2019)   Received from [facility]   Hunger Vital Sign    Within the past 12 months, you worried that your food would run out before you got the money to buy more.: Never true    Within the past 12 months, the food you bought just didn't last and you didn't have money to get more.: Never true  Transportation Needs: No Transportation Needs (11/21/2019)   Received from [facility]   PRAPARE - Transportation    Lack of Transportation (Medical): No    Lack of Transportation (Non-Medical): No  Physical Activity: Not on file  Stress: Not on file  Social Connections: Not on file  Intimate Partner Violence: Not on file    Review of Systems  Constitutional: Negative.   HENT: Negative.  Eyes: Negative.   Respiratory: Negative.  Negative for shortness of breath.   Cardiovascular: Negative.  Negative for chest pain.  Gastrointestinal: Negative.  Negative for abdominal pain, constipation and diarrhea.  Genitourinary: Negative.   Musculoskeletal:  Negative for joint pain and myalgias.  Skin:  Positive for rash.  Neurological: Negative.  Negative for dizziness and headaches.  Endo/Heme/Allergies: Negative.   All other systems reviewed and are negative.       Objective   BP 126/86 (BP Location: Right Arm, Patient Position: Sitting, Cuff Size: Large)   Pulse 83   Ht 5' 7 (1.702 m)   Wt 280 lb (127 kg)   SpO2 99%   BMI 43.85 kg/m    Physical Exam Vitals and nursing note reviewed.  Constitutional:      Appearance: Normal appearance. She is normal weight.  HENT:     Head: Normocephalic and atraumatic.     Nose: Nose normal.     Mouth/Throat:     Mouth: Mucous membranes are moist.   Eyes:     Extraocular Movements: Extraocular movements intact.     Conjunctiva/sclera: Conjunctivae normal.     Pupils: Pupils are equal, round, and reactive to light.    Cardiovascular:     Rate and Rhythm: Normal rate and regular rhythm.     Pulses: Normal pulses.     Heart sounds: Normal heart sounds.  Pulmonary:     Effort: Pulmonary effort is normal.     Breath sounds: Normal breath sounds.  Abdominal:     General: Abdomen is flat. Bowel sounds are normal.     Palpations: Abdomen is soft.   Musculoskeletal:        General: Normal range of motion.     Cervical back: Normal range of motion.   Skin:    General: Skin is warm and dry.   Neurological:     General: No focal deficit present.     Mental Status: She is alert and oriented to person, place, and time.   Psychiatric:        Mood and Affect: Mood normal.        Behavior: Behavior normal.        Thought Content: Thought content normal.        Judgment: Judgment normal.       09/02/2023   10:38 AM  GAD 7 : Generalized Anxiety Score  Nervous, Anxious, on Edge 0  Control/stop worrying 0  Worry too much - different things 0  Trouble relaxing 0  Restless 0  Easily annoyed or irritable 0  Afraid - awful might happen 0  Total GAD 7 Score 0  Anxiety Difficulty Not difficult at all    Flowsheet Row Office Visit from 09/02/2023 in [facility]  Thoughts that you would be better off dead, or of hurting yourself in some way Not at all  PHQ-9 Total Score 0       Assessment & Plan:  Return for fasting lab work. Order for mammogram placed. Referral to dermatology sent.  Problem List Items Addressed This Visit       Musculoskeletal and  Integument   Psoriasis   Relevant Orders   Ambulatory referral to Dermatology     Other   Class 3 severe obesity due to excess calories without serious comorbidity with body mass index (BMI) of 40.0 to 44.9 in adult - Primary   Relevant Orders   CBC with Differential/Platelet   Encounter to establish care  Relevant Orders   CBC with Differential/Platelet   Other Visit Diagnoses       Diabetes mellitus screening       Relevant Orders   CMP14+EGFR   Hemoglobin A1c   CBC with Differential/Platelet     Thyroid disorder screening       Relevant Orders   TSH   CBC with Differential/Platelet     Lipid screening       Relevant Orders   Lipid panel   CBC with Differential/Platelet     Breast cancer screening by mammogram       Relevant Orders   MM 3D SCREENING MAMMOGRAM BILATERAL BREAST       Return in about 6 months (around 03/03/2024).   Total time spent: 25 minutes  [employer], NP  09/02/2023   This document may have been prepared by Dragon Voice Recognition software and as such may include unintentional dictation errors.

## 2023-09-05 ENCOUNTER — Other Ambulatory Visit

## 2023-09-05 DIAGNOSIS — Z7689 Persons encountering health services in other specified circumstances: Secondary | ICD-10-CM

## 2023-09-05 DIAGNOSIS — Z1329 Encounter for screening for other suspected endocrine disorder: Secondary | ICD-10-CM

## 2023-09-05 DIAGNOSIS — Z131 Encounter for screening for diabetes mellitus: Secondary | ICD-10-CM

## 2023-09-05 DIAGNOSIS — Z13220 Encounter for screening for lipoid disorders: Secondary | ICD-10-CM

## 2023-09-05 DIAGNOSIS — Z6841 Body Mass Index (BMI) 40.0 and over, adult: Secondary | ICD-10-CM

## 2023-09-06 ENCOUNTER — Ambulatory Visit: Payer: Self-pay | Admitting: Cardiology

## 2023-09-06 LAB — CBC WITH DIFFERENTIAL/PLATELET
Basophils Absolute: 0.1 10*3/uL (ref 0.0–0.2)
Basos: 1 %
EOS (ABSOLUTE): 0.4 10*3/uL (ref 0.0–0.4)
Eos: 6 %
Hematocrit: 42.2 % (ref 34.0–46.6)
Hemoglobin: 13.3 g/dL (ref 11.1–15.9)
Immature Grans (Abs): 0 10*3/uL (ref 0.0–0.1)
Immature Granulocytes: 0 %
Lymphocytes Absolute: 2.3 10*3/uL (ref 0.7–3.1)
Lymphs: 36 %
MCH: 29.5 pg (ref 26.6–33.0)
MCHC: 31.5 g/dL (ref 31.5–35.7)
MCV: 94 fL (ref 79–97)
Monocytes Absolute: 0.4 10*3/uL (ref 0.1–0.9)
Monocytes: 7 %
Neutrophils Absolute: 3.1 10*3/uL (ref 1.4–7.0)
Neutrophils: 50 %
Platelets: 251 10*3/uL (ref 150–450)
RBC: 4.51 x10E6/uL (ref 3.77–5.28)
RDW: 13.4 % (ref 11.7–15.4)
WBC: 6.2 10*3/uL (ref 3.4–10.8)

## 2023-09-06 LAB — CMP14+EGFR
ALT: 14 IU/L (ref 0–32)
AST: 17 IU/L (ref 0–40)
Albumin: 3.7 g/dL — ABNORMAL LOW (ref 3.9–4.9)
Alkaline Phosphatase: 117 IU/L (ref 44–121)
BUN/Creatinine Ratio: 15 (ref 12–28)
BUN: 14 mg/dL (ref 8–27)
Bilirubin Total: 0.3 mg/dL (ref 0.0–1.2)
CO2: 22 mmol/L (ref 20–29)
Calcium: 9.2 mg/dL (ref 8.7–10.3)
Chloride: 103 mmol/L (ref 96–106)
Creatinine, Ser: 0.91 mg/dL (ref 0.57–1.00)
Globulin, Total: 2.5 g/dL (ref 1.5–4.5)
Glucose: 89 mg/dL (ref 70–99)
Potassium: 4.4 mmol/L (ref 3.5–5.2)
Sodium: 138 mmol/L (ref 134–144)
Total Protein: 6.2 g/dL (ref 6.0–8.5)
eGFR: 69 mL/min/{1.73_m2} (ref >59)

## 2023-09-06 LAB — LIPID PANEL
Chol/HDL Ratio: 3.4 ratio (ref 0.0–4.4)
Cholesterol, Total: 195 mg/dL (ref 100–199)
HDL: 57 mg/dL (ref >39)
LDL Chol Calc (NIH): 126 mg/dL — ABNORMAL HIGH (ref 0–99)
Triglycerides: 68 mg/dL (ref 0–149)
VLDL Cholesterol Cal: 12 mg/dL (ref 5–40)

## 2023-09-06 LAB — HEMOGLOBIN A1C
Est. average glucose Bld gHb Est-mCnc: 131 mg/dL
Hgb A1c MFr Bld: 6.2 % — ABNORMAL HIGH (ref 4.8–5.6)

## 2023-09-06 LAB — TSH: TSH: 1.91 u[IU]/mL (ref 0.450–4.500)

## 2023-09-15 ENCOUNTER — Telehealth: Payer: Self-pay

## 2023-09-15 NOTE — Telephone Encounter (Signed)
 Patient called about the information for the dermatologist, gave patient the number to [facility] and they informed her they are not currently accepting new patients so the referral needs to go to another location

## 2023-09-16 ENCOUNTER — Other Ambulatory Visit: Payer: Self-pay | Admitting: Cardiology

## 2023-09-16 DIAGNOSIS — L409 Psoriasis, unspecified: Secondary | ICD-10-CM

## 2023-09-27 NOTE — Unmapped (Signed)
 [name] Clinical Assessment & Refill Coordination Note    -Doing well, psoriasis has cleared up while on Skyrizi     Teresa Sloan, DOB: 1956/01/19  Phone: [phone] (home)     All above HIPAA information was verified with patient.     Was a Nurse, learning disability used for this call? No    Specialty Medication(s):   Inflammatory Disorders: Skyrizi      Current Medications[1]     Changes to medications: Journi reports no changes at this time.    Medication list has been reviewed and updated in Epic: Yes    Allergies[2]    Changes to allergies: No    Allergies have been reviewed and updated in Epic: Yes    SPECIALTY MEDICATION ADHERENCE         Medication Adherence    Adherence tools used: calendar          Specialty medication(s) dose(s) confirmed: Regimen is correct and unchanged.     Are there any concerns with adherence? No    Adherence counseling provided? Not needed    CLINICAL MANAGEMENT AND INTERVENTION      Clinical Benefit Assessment:    Do you feel the medicine is effective or helping your condition? Yes    Clinical Benefit counseling provided? Progress note from 07/11/23 shows evidence of clinical benefit    Adverse Effects Assessment:    Are you experiencing any side effects? No    Are you experiencing difficulty administering your medicine? No    Quality of Life Assessment:    Quality of Life    Rheumatology  Oncology  Dermatology  1. What impact has your specialty medication had on the symptoms of your skin condition (i.e. itchiness, soreness, stinging)?: Tremendous  2. What impact has your specialty medication had on your comfort level with your skin?: Tremendous  Cystic Fibrosis          How many days over the past month did your psoriasis  keep you from your normal activities? For example, brushing your teeth or getting up in the morning. 0    Have you discussed this with your provider? Not needed    Acute Infection Status:    Acute infections noted within Epic:  No active infections    Patient reported infection: None    Therapy Appropriateness:    Is therapy appropriate based on current medication list, adverse reactions, adherence, clinical benefit and progress toward achieving therapeutic goals? Yes, therapy is appropriate and should be continued     Clinical Intervention:    Was an intervention completed as part of this clinical assessment? No    DISEASE/MEDICATION-SPECIFIC INFORMATION      For patients on injectable medications: Patient currently has 0 doses left.  Next injection is scheduled for 7/15.    Chronic Inflammatory Diseases: Have you experienced any flares in the last month? No    PATIENT SPECIFIC NEEDS     Does the patient have any physical, cognitive, or cultural barriers? No    Is the patient high risk? No    Does the patient require physician intervention or other additional services (i.e., nutrition, smoking cessation, social work)? No    Does the patient have an additional or emergency contact listed in their chart? Yes    SOCIAL DETERMINANTS OF HEALTH     At the [facility], we have learned that life circumstances - like trouble affording food, housing, utilities, or transportation can affect the health of many of  our patients.   That is why we wanted to ask: are you currently experiencing any life circumstances that are negatively impacting your health and/or quality of life? No    Social Drivers of Health     Food Insecurity: No Food Insecurity (11/21/2019)    Hunger Vital Sign     Worried About Running Out of Food in the Last Year: Never true     Ran Out of Food in the Last Year: Never true   Tobacco Use: Low Risk  (07/11/2023)    Patient History     Smoking Tobacco Use: Never     Smokeless Tobacco Use: Never     Passive Exposure: Never   Transportation Needs: No Transportation Needs (11/21/2019)    PRAPARE - Transportation     Lack of Transportation (Medical): No     Lack of Transportation (Non-Medical): No   Alcohol Use: Not on file   Housing: Not on file Physical Activity: Not on file   Utilities: Not on file   Stress: Not on file   Interpersonal Safety: Not on file   Substance Use: Not on file (01/30/2023)   Intimate Partner Violence: Not on file   Social Connections: Not on file   Financial Resource Strain: Low Risk  (11/21/2019)    Overall Financial Resource Strain (CARDIA)     Difficulty of Paying Living Expenses: Not very hard   Health Literacy: Not on file   Internet Connectivity: Not on file       Would you be willing to receive help with any of the needs that you have identified today? Not applicable       SHIPPING     Specialty Medication(s) to be Shipped:   Inflammatory Disorders: Skyrizi     Other medication(s) to be shipped: No additional medications requested for fill at this time     Changes to insurance: No    Cost and Payment: Patient has a $0 copay, payment information is not required.    Delivery Scheduled: Yes, Expected medication delivery date: 7/10.     Medication will be delivered via Same Day Courier to the confirmed prescription address in [facility].    The patient will receive a drug information handout for each medication shipped and additional FDA Medication Guides as required.  Verified that patient has previously received a Conservation officer, historic buildings and a Surveyor, mining.    The patient or caregiver noted above participated in the development of this care plan and knows that they can request review of or adjustments to the care plan at any time.      All of the patient's questions and concerns have been addressed.    Rosalynn GORMAN Kin, PharmD   [facility] Specialty Pharmacist       [1]   Current Outpatient Medications   Medication Sig Dispense Refill    amLODIPine  (NORVASC ) 10 MG tablet Take 1 tablet (10 mg total) by mouth daily. 30 tablet 11    ammonium lactate  (LAC-HYDRIN ) 12 % lotion Apply 1 application topically once daily. 400 g 6    benzoyl peroxide  10 % Clsr WASH AFFECTED AREA UNDER ARMS EACH DAY OR A FEW TIMES WEEKLY AS TOLERATED LATHER 1 MINUTES BEFORE RINISING 142 g 0    betamethasone  dipropionate 0.05 % ointment Apply topically two (2) times a day. 30 g 2    ciclopirox (PENLAC) 8 % solution Apply over nail and surrounding skin. Apply daily over previous coat. After seven (  7) days, may remove with alcohol and continue cycle. 6.6 mL 1    clindamycin  (CLEOCIN  T) 1 % lotion To affected areas in armpit, groin, abdomen daily 120 mL 10    clobetasoL  (TEMOVATE ) 0.05 % ointment Apply to thick areas of psoriasis twice daily as needed. Avoid face and skin folds 60 g 3    clobetasol  (TEMOVATE ) 0.05 % ointment Apply topically Two (2) times a day. 60 g 1    empty container (SHARPS-A-GATOR DISPOSAL SYSTEM) Misc use to dispose of needles 1 each 2    empty container Misc Use as directed to dispose of Cosentyx  pens. 1 each 2    hydroCHLOROthiazide  (HYDRODIURIL ) 25 MG tablet Take 1 tablet (25 mg total) by mouth daily. 30 tablet 11    ketoconazole  (NIZORAL ) 2 % cream Apply 0.25 grams to affected areas underarms, chest, groin daily 60 g 5    ketoconazole  (NIZORAL ) 2 % shampoo Apply topically once a week. Leave in for 5 minutes before rinsing 120 mL 5    lisinopriL  (PRINIVIL ,ZESTRIL ) 20 MG tablet Take 1 tablet (20 mg total) by mouth daily. For high blood pressure 90 tablet 3    methocarbamol (ROBAXIN) 750 MG tablet Take 1 tablet (750 mg total) by mouth four (4) times a day.      risankizumab -rzaa (SKYRIZI ) 150 mg/mL PnIj Inject the contents of 1 pen (150 mg total) under the skin every 12 weeks. 1 mL 3    tretinoin  (RETIN-A ) 0.05 % cream Apply thin layer to underarms a few nights a week. Increase as tolerated. 45 g 5    triamcinolone  (KENALOG ) 0.1 % ointment Apply to thin areas of psoriasis twice daily as needed 80 g 6    triamcinolone  (KENALOG ) 0.1 % ointment Apply topically two (2) times a day. 454 g 2    urea  (CARMOL) 20 % cream Apply daily as needed to soften psoriasis areas on body - can combine with clobetasol  85 g 6     No current facility-administered medications for this visit.   [2] No Known Allergies

## 2023-09-28 DIAGNOSIS — Z1231 Encounter for screening mammogram for malignant neoplasm of breast: Principal | ICD-10-CM

## 2023-09-29 MED FILL — SKYRIZI 150 MG/ML SUBCUTANEOUS PEN INJECTOR: SUBCUTANEOUS | 84 days supply | Qty: 1 | Fill #2

## 2023-12-16 NOTE — Unmapped (Signed)
 [facility] Refill Coordination Note    Specialty Medication(s) to be Shipped:   Inflammatory Disorders: Skyrizi     Other medication(s) to be shipped: No additional medications requested for fill at this time    Specialty Medications not needed at this time: N/A     Teresa Sloan, DOB: 11-03-1955  Phone: [phone] (home)       All above HIPAA information was verified with patient.     Was a Nurse, learning disability used for this call? No    Completed refill call assessment today to schedule patient's medication shipment from the [facility]  [phone]).  All relevant notes have been reviewed.     Specialty medication(s) and dose(s) confirmed: Regimen is correct and unchanged.   Changes to medications: Teresa Sloan reports starting the following medications: creams mometasone under the arm  and hydroxyzine started  all 10/03/23 fluconazole , tacrolimus ointment  and ketoconazole  cream   Changes to insurance: No  New side effects reported not previously addressed with a pharmacist or physician: None reported  Questions for the pharmacist: No    Confirmed patient received a Conservation officer, historic buildings and a Surveyor, mining with first shipment. The patient will receive a drug information handout for each medication shipped and additional FDA Medication Guides as required.       DISEASE/MEDICATION-SPECIFIC INFORMATION        For patients on injectable medications: Next injection is scheduled for 12/29/2023.    SPECIALTY MEDICATION ADHERENCE     Medication Adherence    Specialty Medication: SKYRIZI  150 mg/mL Pnij (risankizumab -rzaa)  Patient is on additional specialty medications: No  Adherence tools used: calendar              Were doses missed due to medication being on hold? No    SKYRIZI  150 mg/mL Pnij (risankizumab -rzaa)  : 0 doses of medicine on hand        REFERRAL TO PHARMACIST     Referral to the pharmacist: Not needed      SHIPPING     Shipping address confirmed in Epic. Cost and Payment: Patient has a $0 copay, payment information is not required.    Delivery Scheduled: Yes, Expected medication delivery date: 12/29/2023.     Medication will be delivered via Same Day Courier to the prescription address in Epic [facility].    Teresa Sloan   [facility]  Specialty Technician

## 2023-12-27 ENCOUNTER — Ambulatory Visit: Admit: 2023-12-27 | Payer: MEDICARE

## 2023-12-29 MED FILL — SKYRIZI 150 MG/ML SUBCUTANEOUS PEN INJECTOR: SUBCUTANEOUS | 84 days supply | Qty: 1 | Fill #3

## 2024-01-05 ENCOUNTER — Inpatient Hospital Stay: Admit: 2024-01-05 | Discharge: 2024-01-05 | Payer: MEDICARE

## 2024-02-22 DIAGNOSIS — L409 Psoriasis, unspecified: Principal | ICD-10-CM

## 2024-02-22 MED ORDER — CLOBETASOL 0.05 % TOPICAL OINTMENT
Freq: Two times a day (BID) | TOPICAL | 1 refills | 0.00000 days | Status: CP
Start: 2024-02-22 — End: 2025-02-21

## 2024-03-06 ENCOUNTER — Ambulatory Visit: Admitting: Cardiology

## 2024-03-19 DIAGNOSIS — L409 Psoriasis, unspecified: Principal | ICD-10-CM

## 2024-03-19 MED ORDER — RISANKIZUMAB-RZAA 150 MG/ML SUBCUTANEOUS PEN INJECTOR
3 refills | 0.00000 days
Start: 2024-03-19 — End: ?

## 2024-03-19 NOTE — Progress Notes (Signed)
 [facility] Refill Coordination Note    Specialty Medication(s) to be Shipped:   Inflammatory Disorders: Skyrizi     Other medication(s) to be shipped: No additional medications requested for fill at this time    Specialty Medications not needed at this time: N/A     Teresa Sloan, DOB: [birth year]  Phone: There are no phone numbers on file.      All above HIPAA information was verified with patient.     Was a nurse, learning disability used for this call? No    Completed refill call assessment today to schedule patient's medication shipment from the [facility]  [phone]).  All relevant notes have been reviewed.     Specialty medication(s) and dose(s) confirmed: Regimen is correct and unchanged.   Changes to medications: Rafael reports no changes at this time.  Changes to insurance: No  New side effects reported not previously addressed with a pharmacist or physician: None reported  Questions for the pharmacist: No    Confirmed patient received a Conservation Officer, Historic Buildings and a Surveyor, Mining with first shipment. The patient will receive a drug information handout for each medication shipped and additional FDA Medication Guides as required.       DISEASE/MEDICATION-SPECIFIC INFORMATION        N/A    SPECIALTY MEDICATION ADHERENCE     Medication Adherence    Patient reported X missed doses in the last month: 0  Specialty Medication: SKYRIZI  150 mg/mL Pnij (risankizumab -rzaa)  Patient is on additional specialty medications: No  Adherence tools used: calendar              Were doses missed due to medication being on hold? No     SKYRIZI  150 mg/mL Pnij (risankizumab -rzaa): 0 doses of medicine on hand       Specialty medication is an injection or given on a cycle: Yes, Next injection is scheduled for 03/22/24.    REFERRAL TO PHARMACIST     Referral to the pharmacist: Not needed      [facility]     Shipping address confirmed in Epic.     Cost and Payment: Patient has a $0 copay, payment information is not required.    Delivery Scheduled: Yes, Expected medication delivery date: 03/23/24.  However, Rx request for refills was sent to the provider as there are none remaining.     Medication will be delivered via Next Day Courier to the prescription address in Epic WAM.    Dena LOISE Dolores   [facility]  Specialty Technician

## 2024-03-20 MED ORDER — RISANKIZUMAB-RZAA 150 MG/ML SUBCUTANEOUS PEN INJECTOR
SUBCUTANEOUS | 3 refills | 0.00000 days | Status: CP
Start: 2024-03-20 — End: ?
  Filled 2024-03-23: qty 1, 84d supply, fill #0

## 2024-03-23 NOTE — Progress Notes (Signed)
 Teresa Sloan 's SKYRIZI  150 mg/mL Pnij (risankizumab -rzaa) shipment will be delayed as a result of no credit card on file to collect copayment.     I have reached out to the patient  at [phone] and communicated the delay. We will wait for a call back from the patient to reschedule the delivery.  We have not confirmed the new delivery date.

## 2024-03-23 NOTE — Progress Notes (Signed)
 Kendrea M Boudoin 's SKYRIZI  150 mg/mL Pnij (risankizumab -rzaa)  shipment will be sent out as a result of credit card on file and copay collected.     I have reached out to the patient  via incoming phone call and communicated the delivery change. We will reschedule the medication for the next available business date. We have confirmed the delivery date as 03/24/2024, via next day courier.

## 2024-03-30 ENCOUNTER — Encounter: Payer: Self-pay | Admitting: Cardiology

## 2024-03-30 ENCOUNTER — Ambulatory Visit: Admitting: Cardiology

## 2024-03-30 VITALS — BP 116/86 | HR 100 | Ht 67.0 in | Wt 287.0 lb

## 2024-03-30 DIAGNOSIS — I10 Essential (primary) hypertension: Secondary | ICD-10-CM

## 2024-03-30 DIAGNOSIS — Z1329 Encounter for screening for other suspected endocrine disorder: Secondary | ICD-10-CM | POA: Diagnosis not present

## 2024-03-30 DIAGNOSIS — Z6841 Body Mass Index (BMI) 40.0 and over, adult: Secondary | ICD-10-CM | POA: Diagnosis not present

## 2024-03-30 DIAGNOSIS — E66813 Obesity, class 3: Secondary | ICD-10-CM

## 2024-03-30 DIAGNOSIS — Z131 Encounter for screening for diabetes mellitus: Secondary | ICD-10-CM

## 2024-03-30 DIAGNOSIS — Z13220 Encounter for screening for lipoid disorders: Secondary | ICD-10-CM

## 2024-03-30 NOTE — Progress Notes (Signed)
 "  Established Patient Office Visit  Subjective:  Patient ID: Deborah Becker, female    DOB: January 30, 1956  Age: 69 y.o. MRN: [id number]  Chief Complaint  Patient presents with   Follow-up    6 months follow up    Patient in office for 6 month follow up. Patient doing well, no new complaints today. Reports right knee pain, seeing orthopaedics.  Cologuard 2024 negative. Due for fasting lab work, will do today.  Due for Dexa scan, insurance will not pay for it.  Patient requesting a letter for dismissal from jury duty, letter written.  Continue current medications.     No other concerns at this time.   Past Medical History:  Diagnosis Date   Psoriasis     Past Surgical History:  Procedure Laterality Date   KNEE SURGERY      Social History   Socioeconomic History   Marital status: Legally Separated    Spouse name: Not on file   Number of children: Not on file   Years of education: Not on file   Highest education level: Not on file  Occupational History   Not on file  Tobacco Use   Smoking status: Never   Smokeless tobacco: Not on file  Substance and Sexual Activity   Alcohol use: No   Drug use: Not on file   Sexual activity: Not on file  Other Topics Concern   Not on file  Social History Narrative   Not on file   Social Drivers of Health   Tobacco Use: Unknown (03/30/2024)   Patient History    Smoking Tobacco Use: Never    Smokeless Tobacco Use: Unknown    Passive Exposure: Not on file  Financial Resource Strain: Not on file  Food Insecurity: Not on file  Transportation Needs: Not on file  Physical Activity: Not on file  Stress: Not on file  Social Connections: Not on file  Intimate Partner Violence: Not on file  Depression (PHQ2-9): Low Risk (09/02/2023)   Depression (PHQ2-9)    PHQ-2 Score: 0  Alcohol Screen: Not on file  Housing: Not on file  Utilities: Not on file  Health Literacy: Not on file    Family History  Problem Relation Age of Onset    Breast cancer Mother 2   Breast cancer Sister 85   Breast cancer Maternal Aunt 70    Allergies[1]  Show/hide medication list[2]  Review of Systems  Constitutional: Negative.   HENT: Negative.    Eyes: Negative.   Respiratory: Negative.  Negative for shortness of breath.   Cardiovascular: Negative.  Negative for chest pain.  Gastrointestinal: Negative.  Negative for abdominal pain, constipation and diarrhea.  Genitourinary: Negative.   Musculoskeletal:  Positive for joint pain. Negative for myalgias.  Skin: Negative.   Neurological: Negative.  Negative for dizziness and headaches.  Endo/Heme/Allergies: Negative.   All other systems reviewed and are negative.      Objective:   BP 116/86 (BP Location: Right Arm, Patient Position: Sitting, Cuff Size: Large)   Pulse 100   Ht 5' 7 (1.702 m)   Wt 287 lb (130.2 kg)   SpO2 96%   BMI 44.95 kg/m   Vitals:   03/30/24 1107 03/30/24 1123  BP: (!) 142/84 116/86  Pulse: 100   Height: 5' 7 (1.702 m)   Weight: 287 lb (130.2 kg)   SpO2: 96%   BMI (Calculated): 44.94     Physical Exam Vitals and nursing note reviewed.  Constitutional:  Appearance: Normal appearance. She is normal weight.  HENT:     Head: Normocephalic and atraumatic.     Nose: Nose normal.     Mouth/Throat:     Mouth: Mucous membranes are moist.  Eyes:     Extraocular Movements: Extraocular movements intact.     Conjunctiva/sclera: Conjunctivae normal.     Pupils: Pupils are equal, round, and reactive to light.  Cardiovascular:     Rate and Rhythm: Normal rate and regular rhythm.     Pulses: Normal pulses.     Heart sounds: Normal heart sounds.  Pulmonary:     Effort: Pulmonary effort is normal.     Breath sounds: Normal breath sounds.  Abdominal:     General: Abdomen is flat. Bowel sounds are normal.     Palpations: Abdomen is soft.  Musculoskeletal:        General: Normal range of motion.     Cervical back: Normal range of motion.   Skin:    General: Skin is warm and dry.  Neurological:     General: No focal deficit present.     Mental Status: She is alert and oriented to person, place, and time.  Psychiatric:        Mood and Affect: Mood normal.        Behavior: Behavior normal.        Thought Content: Thought content normal.        Judgment: Judgment normal.      No results found for any visits on 03/30/24.  No results found for this or any previous visit (from the past 2160 hours).    Assessment & Plan:  Fasting lab work today Continue current medications  Problem List Items Addressed This Visit       Cardiovascular and Mediastinum   HTN (hypertension) - Primary   Relevant Orders   CMP14+EGFR     Other   Class 3 severe obesity due to excess calories without serious comorbidity with body mass index (BMI) of 40.0 to 44.9 in adult [facility])   Other Visit Diagnoses       Diabetes mellitus screening       Relevant Orders   Hemoglobin A1c     Thyroid disorder screening       Relevant Orders   TSH     Lipid screening       Relevant Orders   Lipid Profile       Return in about 6 months (around 09/27/2024) for fasting lab work prior.   Total time spent: 25 minutes. This time includes review of previous notes and results and patient face to face interaction during today's visit.    Jeoffrey Pollen, NP  03/30/2024   This document may have been prepared by Dragon Voice Recognition software and as such may include unintentional dictation errors.     [1] No Known Allergies [2]  Outpatient Medications Prior to Visit  Medication Sig   betamethasone dipropionate (DIPROLENE) 0.05 % ointment Apply topically.   clindamycin (CLEOCIN T) 1 % lotion Apply topically.   clobetasol ointment (TEMOVATE) 0.05 % Apply topically.   ketoconazole (NIZORAL) 2 % cream Apply topically.   ketoconazole (NIZORAL) 2 % shampoo Apply topically once a week.   risankizumab-rzaa (SKYRIZI) 150 MG/ML pen Inject the contents  of 1 pen (150 mg total) under the skin every 12 weeks.   triamcinolone ointment (KENALOG) 0.1 % Apply topically 2 (two) times daily.   No facility-administered medications prior to visit.   "

## 2024-03-31 LAB — LIPID PANEL
Chol/HDL Ratio: 3.8 ratio (ref 0.0–4.4)
Cholesterol, Total: 216 mg/dL — ABNORMAL HIGH (ref 100–199)
HDL: 57 mg/dL
LDL Chol Calc (NIH): 143 mg/dL — ABNORMAL HIGH (ref 0–99)
Triglycerides: 91 mg/dL (ref 0–149)
VLDL Cholesterol Cal: 16 mg/dL (ref 5–40)

## 2024-03-31 LAB — CMP14+EGFR
ALT: 19 IU/L (ref 0–32)
AST: 17 IU/L (ref 0–40)
Albumin: 4.1 g/dL (ref 3.9–4.9)
Alkaline Phosphatase: 134 IU/L (ref 49–135)
BUN/Creatinine Ratio: 15 (ref 12–28)
BUN: 14 mg/dL (ref 8–27)
Bilirubin Total: 0.5 mg/dL (ref 0.0–1.2)
CO2: 24 mmol/L (ref 20–29)
Calcium: 9.7 mg/dL (ref 8.7–10.3)
Chloride: 99 mmol/L (ref 96–106)
Creatinine, Ser: 0.91 mg/dL (ref 0.57–1.00)
Globulin, Total: 3.4 g/dL (ref 1.5–4.5)
Glucose: 107 mg/dL — ABNORMAL HIGH (ref 70–99)
Potassium: 4.6 mmol/L (ref 3.5–5.2)
Sodium: 138 mmol/L (ref 134–144)
Total Protein: 7.5 g/dL (ref 6.0–8.5)
eGFR: 69 mL/min/1.73

## 2024-03-31 LAB — HEMOGLOBIN A1C
Est. average glucose Bld gHb Est-mCnc: 131 mg/dL
Hgb A1c MFr Bld: 6.2 % — ABNORMAL HIGH (ref 4.8–5.6)

## 2024-03-31 LAB — TSH: TSH: 2.07 u[IU]/mL (ref 0.450–4.500)

## 2024-04-02 ENCOUNTER — Ambulatory Visit: Payer: Self-pay | Admitting: Cardiology

## 2024-09-27 ENCOUNTER — Ambulatory Visit: Admitting: Family
